# Patient Record
Sex: Male | Born: 1940 | Race: White | Hispanic: No | Smoking: Former smoker
Health system: Southern US, Community
[De-identification: ages and names within clinical notes are randomized; demographics above are authoritative.]

## PROBLEM LIST (undated history)

## (undated) DIAGNOSIS — E7800 Pure hypercholesterolemia, unspecified: Secondary | ICD-10-CM

## (undated) DIAGNOSIS — J449 Chronic obstructive pulmonary disease, unspecified: Secondary | ICD-10-CM

## (undated) DIAGNOSIS — I10 Essential (primary) hypertension: Secondary | ICD-10-CM

## (undated) HISTORY — DX: Essential (primary) hypertension: I10

## (undated) HISTORY — DX: Pure hypercholesterolemia, unspecified: E78.00

## (undated) HISTORY — PX: FACIAL FRACTURE SURGERY: SHX1570

## (undated) HISTORY — PX: LEG SURGERY: SHX1003

---

## 2004-03-24 ENCOUNTER — Ambulatory Visit (HOSPITAL_COMMUNITY): Admission: RE | Admit: 2004-03-24 | Discharge: 2004-03-24 | Payer: Self-pay | Admitting: Family Medicine

## 2004-05-27 ENCOUNTER — Emergency Department (HOSPITAL_COMMUNITY): Admission: EM | Admit: 2004-05-27 | Discharge: 2004-05-27 | Payer: Self-pay | Admitting: Emergency Medicine

## 2005-08-12 ENCOUNTER — Emergency Department (HOSPITAL_COMMUNITY): Admission: EM | Admit: 2005-08-12 | Discharge: 2005-08-12 | Payer: Self-pay | Admitting: Emergency Medicine

## 2005-09-21 ENCOUNTER — Ambulatory Visit (HOSPITAL_COMMUNITY): Admission: RE | Admit: 2005-09-21 | Discharge: 2005-09-21 | Payer: Self-pay | Admitting: General Surgery

## 2008-05-27 ENCOUNTER — Ambulatory Visit (HOSPITAL_COMMUNITY): Admission: RE | Admit: 2008-05-27 | Discharge: 2008-05-27 | Payer: Self-pay | Admitting: Family Medicine

## 2008-07-30 ENCOUNTER — Ambulatory Visit (HOSPITAL_COMMUNITY): Admission: RE | Admit: 2008-07-30 | Discharge: 2008-07-30 | Payer: Self-pay | Admitting: Family Medicine

## 2009-06-27 ENCOUNTER — Emergency Department (HOSPITAL_COMMUNITY): Admission: EM | Admit: 2009-06-27 | Discharge: 2009-06-27 | Payer: Self-pay | Admitting: Emergency Medicine

## 2009-08-18 ENCOUNTER — Emergency Department (HOSPITAL_COMMUNITY): Admission: EM | Admit: 2009-08-18 | Discharge: 2009-08-18 | Payer: Self-pay | Admitting: Emergency Medicine

## 2010-07-26 ENCOUNTER — Encounter: Payer: Self-pay | Admitting: Gastroenterology

## 2010-07-26 ENCOUNTER — Encounter: Payer: Self-pay | Admitting: Internal Medicine

## 2010-07-26 ENCOUNTER — Ambulatory Visit (INDEPENDENT_AMBULATORY_CARE_PROVIDER_SITE_OTHER): Payer: Medicare Other | Admitting: Gastroenterology

## 2010-07-26 DIAGNOSIS — Z1211 Encounter for screening for malignant neoplasm of colon: Secondary | ICD-10-CM
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
## 2010-08-04 NOTE — Letter (Signed)
Summary: TCS ODER  TCS ODER   Imported By: Rexene Alberts 07/26/2010 14:33:00  _____________________________________________________________________  External Attachment:    Type:   Image     Comment:   External Document

## 2010-08-10 ENCOUNTER — Ambulatory Visit (HOSPITAL_COMMUNITY): Admission: RE | Admit: 2010-08-10 | Payer: Medicare Other | Source: Ambulatory Visit | Admitting: Internal Medicine

## 2010-08-10 ENCOUNTER — Encounter: Payer: Medicare Other | Admitting: Internal Medicine

## 2010-09-05 LAB — BASIC METABOLIC PANEL
BUN: 15 mg/dL (ref 6–23)
CO2: 27 mEq/L (ref 19–32)
Calcium: 8.6 mg/dL (ref 8.4–10.5)
Chloride: 98 mEq/L (ref 96–112)
Creatinine, Ser: 0.9 mg/dL (ref 0.4–1.5)
GFR calc Af Amer: >60 mL/min (ref >60)
GFR calc non Af Amer: >60 mL/min (ref >60)
Glucose, Bld: 100 mg/dL — ABNORMAL HIGH (ref 70–99)
Potassium: 3.8 mEq/L (ref 3.5–5.1)
Sodium: 134 mEq/L — ABNORMAL LOW (ref 135–145)

## 2010-09-05 LAB — CBC
HCT: 45.5 % (ref 39.0–52.0)
Hemoglobin: 16.5 g/dL (ref 13.0–17.0)
MCHC: 36.2 g/dL — ABNORMAL HIGH (ref 30.0–36.0)
MCV: 94.7 fL (ref 78.0–100.0)
Platelets: 218 10*3/uL (ref 150–400)
RBC: 4.8 MIL/uL (ref 4.22–5.81)
RDW: 12.9 % (ref 11.5–15.5)
WBC: 8.8 10*3/uL (ref 4.0–10.5)

## 2010-09-05 LAB — DIFFERENTIAL
Basophils Absolute: 0 10*3/uL (ref 0.0–0.1)
Basophils Relative: 0 % (ref 0–1)
Eosinophils Absolute: 0 10*3/uL (ref 0.0–0.7)
Eosinophils Relative: 0 % (ref 0–5)
Lymphocytes Relative: 18 % (ref 12–46)
Lymphs Abs: 1.6 10*3/uL (ref 0.7–4.0)
Monocytes Absolute: 1 10*3/uL (ref 0.1–1.0)
Monocytes Relative: 12 % (ref 3–12)
Neutro Abs: 6.1 10*3/uL (ref 1.7–7.7)
Neutrophils Relative %: 70 % (ref 43–77)

## 2010-09-05 LAB — POCT CARDIAC MARKERS
CKMB, poc: 1.6 ng/mL (ref 1.0–8.0)
Myoglobin, poc: 339 ng/mL (ref 12–200)
Troponin i, poc: <0.05 ng/mL (ref 0.00–0.09)

## 2013-10-16 ENCOUNTER — Emergency Department (HOSPITAL_COMMUNITY): Payer: Medicare Other

## 2013-10-16 ENCOUNTER — Encounter (HOSPITAL_COMMUNITY): Payer: Self-pay | Admitting: Emergency Medicine

## 2013-10-16 ENCOUNTER — Emergency Department (HOSPITAL_COMMUNITY)
Admission: EM | Admit: 2013-10-16 | Discharge: 2013-10-16 | Disposition: A | Discharge status: Home / Self Care | Payer: Medicare Other | Attending: Emergency Medicine | Admitting: Emergency Medicine

## 2013-10-16 DIAGNOSIS — R4181 Age-related cognitive decline: Secondary | ICD-10-CM | POA: Insufficient documentation

## 2013-10-16 DIAGNOSIS — J40 Bronchitis, not specified as acute or chronic: Secondary | ICD-10-CM

## 2013-10-16 DIAGNOSIS — J209 Acute bronchitis, unspecified: Secondary | ICD-10-CM | POA: Insufficient documentation

## 2013-10-16 DIAGNOSIS — I10 Essential (primary) hypertension: Secondary | ICD-10-CM | POA: Insufficient documentation

## 2013-10-16 DIAGNOSIS — F17200 Nicotine dependence, unspecified, uncomplicated: Secondary | ICD-10-CM | POA: Insufficient documentation

## 2013-10-16 DIAGNOSIS — Z8639 Personal history of other endocrine, nutritional and metabolic disease: Secondary | ICD-10-CM | POA: Insufficient documentation

## 2013-10-16 DIAGNOSIS — Z862 Personal history of diseases of the blood and blood-forming organs and certain disorders involving the immune mechanism: Secondary | ICD-10-CM | POA: Insufficient documentation

## 2013-10-16 DIAGNOSIS — Z720 Tobacco use: Secondary | ICD-10-CM

## 2013-10-16 LAB — CBC WITH DIFFERENTIAL/PLATELET
Basophils Absolute: 0 10*3/uL (ref 0.0–0.1)
Basophils Relative: 0 % (ref 0–1)
Eosinophils Absolute: 0 10*3/uL (ref 0.0–0.7)
Eosinophils Relative: 0 % (ref 0–5)
HCT: 45.4 % (ref 39.0–52.0)
Hemoglobin: 16.3 g/dL (ref 13.0–17.0)
Lymphocytes Relative: 12 % (ref 12–46)
Lymphs Abs: 2.2 10*3/uL (ref 0.7–4.0)
MCH: 34.4 pg — ABNORMAL HIGH (ref 26.0–34.0)
MCHC: 35.9 g/dL (ref 30.0–36.0)
MCV: 95.8 fL (ref 78.0–100.0)
Monocytes Absolute: 1.4 10*3/uL — ABNORMAL HIGH (ref 0.1–1.0)
Monocytes Relative: 8 % (ref 3–12)
Neutro Abs: 14.4 10*3/uL — ABNORMAL HIGH (ref 1.7–7.7)
Neutrophils Relative %: 80 % — ABNORMAL HIGH (ref 43–77)
Platelets: 230 10*3/uL (ref 150–400)
RBC: 4.74 MIL/uL (ref 4.22–5.81)
RDW: 12.8 % (ref 11.5–15.5)
WBC Morphology: INCREASED
WBC: 18 10*3/uL — ABNORMAL HIGH (ref 4.0–10.5)

## 2013-10-16 LAB — TROPONIN I: Troponin I: <0.3 ng/mL (ref <0.30)

## 2013-10-16 MED ORDER — PREDNISONE 50 MG PO TABS
60.0000 mg | ORAL_TABLET | Freq: Once | ORAL | Status: AC
  Administered 2013-10-16: 60 mg via ORAL
  Filled 2013-10-16 (×2): qty 1

## 2013-10-16 MED ORDER — LEVOFLOXACIN 750 MG PO TABS
750.0000 mg | ORAL_TABLET | Freq: Once | ORAL | Status: AC
  Administered 2013-10-16: 750 mg via ORAL
  Filled 2013-10-16: qty 1

## 2013-10-16 MED ORDER — PREDNISONE 20 MG PO TABS: 20.0000 mg | ORAL_TABLET | Freq: Two times a day (BID) | ORAL | Status: DC

## 2013-10-16 MED ORDER — LEVOFLOXACIN 500 MG PO TABS: 500.0000 mg | ORAL_TABLET | Freq: Every day | ORAL | Status: DC

## 2013-10-16 NOTE — Discharge Instructions (Signed)
Bronchitis Bronchitis is inflammation of the airways that extend from the windpipe into the lungs (bronchi). The inflammation often causes mucus to develop, which leads to a cough. If the inflammation becomes severe, it may cause shortness of breath. CAUSES  Bronchitis may be caused by:   Viral infections.   Bacteria.   Cigarette smoke.   Allergens, pollutants, and other irritants.  SIGNS AND SYMPTOMS  The most common symptom of bronchitis is a frequent cough that produces mucus. Other symptoms include:  Fever.   Body aches.   Chest congestion.   Chills.   Shortness of breath.   Sore throat.  DIAGNOSIS  Bronchitis is usually diagnosed through a medical history and physical exam. Tests, such as chest X-rays, are sometimes done to rule out other conditions.  TREATMENT  You may need to avoid contact with whatever caused the problem (smoking, for example). Medicines are sometimes needed. These may include:  Antibiotics. These may be prescribed if the condition is caused by bacteria.  Cough suppressants. These may be prescribed for relief of cough symptoms.   Inhaled medicines. These may be prescribed to help open your airways and make it easier for you to breathe.   Steroid medicines. These may be prescribed for those with recurrent (chronic) bronchitis. HOME CARE INSTRUCTIONS  Get plenty of rest.   Drink enough fluids to keep your urine clear or pale yellow (unless you have a medical condition that requires fluid restriction). Increasing fluids may help thin your secretions and will prevent dehydration.   Only take over-the-counter or prescription medicines as directed by your health care provider.  Only take antibiotics as directed. Make sure you finish them even if you start to feel better.  Avoid secondhand smoke, irritating chemicals, and strong fumes. These will make bronchitis worse. If you are a smoker, quit smoking. Consider using nicotine gum or  skin patches to help control withdrawal symptoms. Quitting smoking will help your lungs heal faster.   Put a cool-mist humidifier in your bedroom at night to moisten the air. This may help loosen mucus. Change the water in the humidifier daily. You can also run the hot water in your shower and sit in the bathroom with the door closed for 5 10 minutes.   Follow up with your health care provider as directed.   Wash your hands frequently to avoid catching bronchitis again or spreading an infection to others.  SEEK MEDICAL CARE IF: Your symptoms do not improve after 1 week of treatment.  SEEK IMMEDIATE MEDICAL CARE IF:  Your fever increases.  You have chills.   You have chest pain.   You have worsening shortness of breath.   You have bloody sputum.  You faint.  You have lightheadedness.  You have a severe headache.   You vomit repeatedly. MAKE SURE YOU:   Understand these instructions.  Will watch your condition.  Will get help right away if you are not doing well or get worse. Document Released: 05/30/2005 Document Revised: 03/20/2013 Document Reviewed: 01/22/2013 [facility] Patient Information 2014 [place].  Smoking Hazards Smoking cigarettes is extremely bad for your health. Tobacco smoke has over 200 known poisons in it. It contains the poisonous gases nitrogen oxide and carbon monoxide. There are over 60 chemicals in tobacco smoke that cause cancer. Some of the chemicals found in cigarette smoke include:   Cyanide.   Benzene.   Formaldehyde.   Methanol (wood alcohol).   Acetylene (fuel used in welding torches).   Ammonia.  Even  smoking lightly shortens your life expectancy by several years. You can greatly reduce the risk of medical problems for you and your family by stopping now. Smoking is the most preventable cause of death and disease in our society. Within days of quitting smoking, your circulation improves, you decrease the risk of  having a heart attack, and your lung capacity improves. There may be some increased phlegm in the first few days after quitting, and it may take months for your lungs to clear up completely. Quitting for 10 years reduces your risk of developing lung cancer to almost that of a nonsmoker.  WHAT ARE THE RISKS OF SMOKING? Cigarette smokers have an increased risk of many serious medical problems, including:  Lung cancer.   Lung disease (such as pneumonia, bronchitis, and emphysema).   Heart attack and chest pain due to the heart not getting enough oxygen (angina).   Heart disease and peripheral blood vessel disease.   Hypertension.   Stroke.   Oral cancer (cancer of the lip, mouth, or voice box).   Bladder cancer.   Pancreatic cancer.   Cervical cancer.   Pregnancy complications, including premature birth.   Stillbirths and smaller newborn babies, birth defects, and genetic damage to sperm.   Early menopause.   Lower estrogen level for women.   Infertility.   Facial wrinkles.   Blindness.   Increased risk of broken bones (fractures).   Senile dementia.   Stomach ulcers and internal bleeding.   Delayed wound healing and increased risk of complications during surgery. Because of secondhand smoke exposure, children of smokers have an increased risk of the following:   Sudden infant death syndrome (SIDS).   Respiratory infections.   Lung cancer.   Heart disease.   Ear infections.  WHY IS SMOKING ADDICTIVE? Nicotine is the chemical agent in tobacco that is capable of causing addiction or dependence. When you smoke and inhale, nicotine is absorbed rapidly into the bloodstream through your lungs. Both inhaled and noninhaled nicotine may be addictive.  WHAT ARE THE BENEFITS OF QUITTING?  There are many health benefits to quitting smoking. Some are:   The likelihood of developing cancer and heart disease decreases. Health improvements are seen  almost immediately.   Blood pressure, pulse rate, and breathing patterns start returning to normal soon after quitting.   People who quit may see an improvement in their overall quality of life.  HOW DO YOU QUIT SMOKING? Smoking is an addiction with both physical and psychological effects, and longtime habits can be hard to change. Your health care provider can recommend:  Programs and community resources, which may include group support, education, or therapy.  Replacement products, such as patches, gum, and nasal sprays. Use these products only as directed. Do not replace cigarette smoking with electronic cigarettes (commonly called e-cigarettes). The safety of e-cigarettes is unknown, and some may contain harmful chemicals. FOR MORE INFORMATION  American Lung Association: www.lung.org  American Cancer Society: www.cancer.org Document Released: 07/07/2004 Document Revised: 03/20/2013 Document Reviewed: 11/19/2012 [facility] Patient Information 2014 [place], [place].  Smoking Cessation Quitting smoking is important to your health and has many advantages. However, it is not always easy to quit since nicotine is a very addictive drug. Often times, people try 3 times or more before being able to quit. This document explains the best ways for you to prepare to quit smoking. Quitting takes hard work and a lot of effort, but you can do it. ADVANTAGES OF QUITTING SMOKING  You will live longer,  feel better, and live better.  Your body will feel the impact of quitting smoking almost immediately.  Within 20 minutes, blood pressure decreases. Your pulse returns to its normal level.  After 8 hours, carbon monoxide levels in the blood return to normal. Your oxygen level increases.  After 24 hours, the chance of having a heart attack starts to decrease. Your breath, hair, and body stop smelling like smoke.  After 48 hours, damaged nerve endings begin to recover. Your sense of taste and smell  improve.  After 72 hours, the body is virtually free of nicotine. Your bronchial tubes relax and breathing becomes easier.  After 2 to 12 weeks, lungs can hold more air. Exercise becomes easier and circulation improves.  The risk of having a heart attack, stroke, cancer, or lung disease is greatly reduced.  After 1 year, the risk of coronary heart disease is cut in half.  After 5 years, the risk of stroke falls to the same as a nonsmoker.  After 10 years, the risk of lung cancer is cut in half and the risk of other cancers decreases significantly.  After 15 years, the risk of coronary heart disease drops, usually to the level of a nonsmoker.  If you are pregnant, quitting smoking will improve your chances of having a healthy baby.  The people you live with, especially any children, will be healthier.  You will have extra money to spend on things other than cigarettes. QUESTIONS TO THINK ABOUT BEFORE ATTEMPTING TO QUIT You may want to talk about your answers with your caregiver.  Why do you want to quit?  If you tried to quit in the past, what helped and what did not?  What will be the most difficult situations for you after you quit? How will you plan to handle them?  Who can help you through the tough times? Your family? Friends? A caregiver?  What pleasures do you get from smoking? What ways can you still get pleasure if you quit? Here are some questions to ask your caregiver:  How can you help me to be successful at quitting?  What medicine do you think would be best for me and how should I take it?  What should I do if I need more help?  What is smoking withdrawal like? How can I get information on withdrawal? GET READY  Set a quit date.  Change your environment by getting rid of all cigarettes, ashtrays, matches, and lighters in your home, car, or work. Do not let people smoke in your home.  Review your past attempts to quit. Think about what worked and what did  not. GET SUPPORT AND ENCOURAGEMENT You have a better chance of being successful if you have help. You can get support in many ways.  Tell your family, friends, and co-workers that you are going to quit and need their support. Ask them not to smoke around you.  Get individual, group, or telephone counseling and support. Programs are available at [facility]. Call your local health department for information about programs in your area.  Spiritual beliefs and practices may help some smokers quit.  Download a "quit meter" on your computer to keep track of quit statistics, such as how long you have gone without smoking, cigarettes not smoked, and money saved.  Get a self-help book about quitting smoking and staying off of tobacco. LEARN NEW SKILLS AND BEHAVIORS  Distract yourself from urges to smoke. Talk to someone, go for a  walk, or occupy your time with a task.  Change your normal routine. Take a different route to work. Drink tea instead of coffee. Eat breakfast in a different place.  Reduce your stress. Take a hot bath, exercise, or read a book.  Plan something enjoyable to do every day. Reward yourself for not smoking.  Explore interactive web-based programs that specialize in helping you quit. GET MEDICINE AND USE IT CORRECTLY Medicines can help you stop smoking and decrease the urge to smoke. Combining medicine with the above behavioral methods and support can greatly increase your chances of successfully quitting smoking.  Nicotine replacement therapy helps deliver nicotine to your body without the negative effects and risks of smoking. Nicotine replacement therapy includes nicotine gum, lozenges, inhalers, nasal sprays, and skin patches. Some may be available over-the-counter and others require a prescription.  Antidepressant medicine helps people abstain from smoking, but how this works is unknown. This medicine is available by prescription.  Nicotinic  receptor partial agonist medicine simulates the effect of nicotine in your brain. This medicine is available by prescription. Ask your caregiver for advice about which medicines to use and how to use them based on your health history. Your caregiver will tell you what side effects to look out for if you choose to be on a medicine or therapy. Carefully read the information on the package. Do not use any other product containing nicotine while using a nicotine replacement product.  RELAPSE OR DIFFICULT SITUATIONS Most relapses occur within the first 3 months after quitting. Do not be discouraged if you start smoking again. Remember, most people try several times before finally quitting. You may have symptoms of withdrawal because your body is used to nicotine. You may crave cigarettes, be irritable, feel very hungry, cough often, get headaches, or have difficulty concentrating. The withdrawal symptoms are only temporary. They are strongest when you first quit, but they will go away within 10 14 days. To reduce the chances of relapse, try to:  Avoid drinking alcohol. Drinking lowers your chances of successfully quitting.  Reduce the amount of caffeine you consume. Once you quit smoking, the amount of caffeine in your body increases and can give you symptoms, such as a rapid heartbeat, sweating, and anxiety.  Avoid smokers because they can make you want to smoke.  Do not let weight gain distract you. Many smokers will gain weight when they quit, usually less than 10 pounds. Eat a healthy diet and stay active. You can always lose the weight gained after you quit.  Find ways to improve your mood other than smoking. FOR MORE INFORMATION  www.smokefree.gov  Document Released: 05/24/2001 Document Revised: 11/29/2011 Document Reviewed: 09/08/2011 [facility] Patient Information 2014 [place], [place].  You Can Quit Smoking If you are ready to quit smoking or are thinking about it, congratulations! You have  chosen to help yourself be healthier and live longer! There are lots of different ways to quit smoking. Nicotine gum, nicotine patches, a nicotine inhaler, or nicotine nasal spray can help with physical craving. Hypnosis, support groups, and medicines help break the habit of smoking. TIPS TO GET OFF AND STAY OFF CIGARETTES  Learn to predict your moods. Do not let a bad situation be your excuse to have a cigarette. Some situations in your life might tempt you to have a cigarette.  Ask friends and co-workers not to smoke around you.  Make your home smoke-free.  Never have "just one" cigarette. It leads to wanting another and another. Remind yourself  of your decision to quit.  On a card, make a list of your reasons for not smoking. Read it at least the same number of times a day as you have a cigarette. Tell yourself everyday, "I do not want to smoke. I choose not to smoke."  Ask someone at home or work to help you with your plan to quit smoking.  Have something planned after you eat or have a cup of coffee. Take a walk or get other exercise to perk you up. This will help to keep you from overeating.  Try a relaxation exercise to calm you down and decrease your stress. Remember, you may be tense and nervous the first two weeks after you quit. This will pass.  Find new activities to keep your hands busy. Play with a pen, coin, or rubber band. Doodle or draw things on paper.  Brush your teeth right after eating. This will help cut down the craving for the taste of tobacco after meals. You can try mouthwash too.  Try gum, breath mints, or diet candy to keep something in your mouth. IF YOU SMOKE AND WANT TO QUIT:  Do not stock up on cigarettes. Never buy a carton. Wait until one pack is finished before you buy another.  Never carry cigarettes with you at work or at home.  Keep cigarettes as far away from you as possible. Leave them with someone else.  Never carry matches or a lighter with  you.  Ask yourself, "Do I need this cigarette or is this just a reflex?"  Bet with someone that you can quit. Put cigarette money in a piggy bank every morning. If you smoke, you give up the money. If you do not smoke, by the end of the week, you keep the money.  Keep trying. It takes 21 days to change a habit!  Talk to your doctor about using medicines to help you quit. These include nicotine replacement gum, lozenges, or skin patches. Document Released: 03/26/2009 Document Revised: 08/22/2011 Document Reviewed: 03/26/2009 [facility] Patient Information 2014 [place], [place].  Smoking Cessation, Tips for Success If you are ready to quit smoking, congratulations! You have chosen to help yourself be healthier. Cigarettes bring nicotine, tar, carbon monoxide, and other irritants into your body. Your lungs, heart, and blood vessels will be able to work better without these poisons. There are many different ways to quit smoking. Nicotine gum, nicotine patches, a nicotine inhaler, or nicotine nasal spray can help with physical craving. Hypnosis, support groups, and medicines help break the habit of smoking. WHAT THINGS CAN I DO TO MAKE QUITTING EASIER?  Here are some tips to help you quit for good:  Pick a date when you will quit smoking completely. Tell all of your friends and family about your plan to quit on that date.  Do not try to slowly cut down on the number of cigarettes you are smoking. Pick a quit date and quit smoking completely starting on that day.  Throw away all cigarettes.   Clean and remove all ashtrays from your home, work, and car.   On a card, write down your reasons for quitting. Carry the card with you and read it when you get the urge to smoke.   Cleanse your body of nicotine. Drink enough water and fluids to keep your urine clear or pale yellow. Do this after quitting to flush the nicotine from your body.   Learn to predict your moods. Do not let a bad situation be  your excuse to have a cigarette. Some situations in your life might tempt you into wanting a cigarette.   Never have "just one" cigarette. It leads to wanting another and another. Remind yourself of your decision to quit.   Change habits associated with smoking. If you smoked while driving or when feeling stressed, try other activities to replace smoking. Stand up when drinking your coffee. Brush your teeth after eating. Sit in a different chair when you read the paper. Avoid alcohol while trying to quit, and try to drink fewer caffeinated beverages. Alcohol and caffeine may urge you to smoke.   Avoid foods and drinks that can trigger a desire to smoke, such as sugary or spicy foods and alcohol.   Ask people who smoke not to smoke around you.   Have something planned to do right after eating or having a cup of coffee. For example, plan to take a walk or exercise.   Try a relaxation exercise to calm you down and decrease your stress. Remember, you may be tense and nervous for the first 2 weeks after you quit, but this will pass.   Find new activities to keep your hands busy. Play with a pen, coin, or rubber band. Doodle or draw things on paper.   Brush your teeth right after eating. This will help cut down on the craving for the taste of tobacco after meals. You can also try mouthwash.   Use oral substitutes in place of cigarettes. Try using lemon drops, carrots, cinnamon sticks, or chewing gum. Keep them handy so they are available when you have the urge to smoke.   When you have the urge to smoke, try deep breathing.   Designate your home as a nonsmoking area.   If you are a heavy smoker, ask your health care provider about a prescription for nicotine chewing gum. It can ease your withdrawal from nicotine.   Reward yourself. Set aside the cigarette money you save and buy yourself something nice.   Look for support from others. Join a support group or smoking cessation  program. Ask someone at home or at work to help you with your plan to quit smoking.   Always ask yourself, "Do I need this cigarette or is this just a reflex?" Tell yourself, "Today, I choose not to smoke," or "I do not want to smoke." You are reminding yourself of your decision to quit.  Do not replace cigarette smoking with electronic cigarettes (commonly called e-cigarettes). The safety of e-cigarettes is unknown, and some may contain harmful chemicals.  If you relapse, do not give up! Plan ahead and think about what you will do the next time you get the urge to smoke.  HOW WILL I FEEL WHEN I QUIT SMOKING? You may have symptoms of withdrawal because your body is used to nicotine (the addictive substance in cigarettes). You may crave cigarettes, be irritable, feel very hungry, cough often, get headaches, or have difficulty concentrating. The withdrawal symptoms are only temporary. They are strongest when you first quit but will go away within 10 14 days. When withdrawal symptoms occur, stay in control. Think about your reasons for quitting. Remind yourself that these are signs that your body is healing and getting used to being without cigarettes. Remember that withdrawal symptoms are easier to treat than the major diseases that smoking can cause.  Even after the withdrawal is over, expect periodic urges to smoke. However, these cravings are generally short lived and will go away whether  you smoke or not. Do not smoke!  WHAT RESOURCES ARE AVAILABLE TO HELP ME QUIT SMOKING? Your health care provider can direct you to community resources or hospitals for support, which may include:  Group support.  Education.  Hypnosis.  Therapy. Document Released: 02/26/2004 Document Revised: 03/20/2013 Document Reviewed: 11/15/2012 [facility] Patient Information 2014 [place], [place].
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
## 2013-10-16 NOTE — ED Notes (Signed)
Pt c/o cough, SOB and chest pain. Pt states cough is productive with green sputum. Pt describes chest pain as "soreness".

## 2013-11-29 ENCOUNTER — Other Ambulatory Visit (HOSPITAL_COMMUNITY): Payer: Self-pay | Admitting: Family Medicine

## 2013-11-29 ENCOUNTER — Ambulatory Visit (HOSPITAL_COMMUNITY)
Admission: RE | Admit: 2013-11-29 | Discharge: 2013-11-29 | Disposition: A | Discharge status: Home / Self Care | Payer: Medicare Other | Source: Ambulatory Visit | Attending: Family Medicine | Admitting: Family Medicine

## 2013-11-29 DIAGNOSIS — M25511 Pain in right shoulder: Secondary | ICD-10-CM

## 2013-11-29 DIAGNOSIS — J449 Chronic obstructive pulmonary disease, unspecified: Secondary | ICD-10-CM

## 2013-11-29 DIAGNOSIS — M25519 Pain in unspecified shoulder: Secondary | ICD-10-CM | POA: Insufficient documentation

## 2013-11-29 DIAGNOSIS — J4489 Other specified chronic obstructive pulmonary disease: Secondary | ICD-10-CM | POA: Insufficient documentation

## 2013-11-29 DIAGNOSIS — M19019 Primary osteoarthritis, unspecified shoulder: Secondary | ICD-10-CM | POA: Insufficient documentation

## 2015-04-13 ENCOUNTER — Inpatient Hospital Stay (HOSPITAL_COMMUNITY)
Admission: EM | Admit: 2015-04-13 | Discharge: 2015-04-17 | DRG: 190 | Disposition: A | Discharge status: Home / Self Care | Payer: Medicare Other | Attending: Family Medicine | Admitting: Family Medicine

## 2015-04-13 ENCOUNTER — Encounter (HOSPITAL_COMMUNITY): Payer: Self-pay

## 2015-04-13 ENCOUNTER — Emergency Department (HOSPITAL_COMMUNITY): Payer: Medicare Other

## 2015-04-13 DIAGNOSIS — J189 Pneumonia, unspecified organism: Secondary | ICD-10-CM | POA: Diagnosis present

## 2015-04-13 DIAGNOSIS — J441 Chronic obstructive pulmonary disease with (acute) exacerbation: Principal | ICD-10-CM | POA: Diagnosis present

## 2015-04-13 DIAGNOSIS — F17210 Nicotine dependence, cigarettes, uncomplicated: Secondary | ICD-10-CM | POA: Diagnosis present

## 2015-04-13 DIAGNOSIS — I10 Essential (primary) hypertension: Secondary | ICD-10-CM | POA: Diagnosis not present

## 2015-04-13 DIAGNOSIS — Z720 Tobacco use: Secondary | ICD-10-CM | POA: Diagnosis present

## 2015-04-13 DIAGNOSIS — E785 Hyperlipidemia, unspecified: Secondary | ICD-10-CM | POA: Diagnosis present

## 2015-04-13 DIAGNOSIS — E7800 Pure hypercholesterolemia, unspecified: Secondary | ICD-10-CM | POA: Diagnosis present

## 2015-04-13 DIAGNOSIS — R0600 Dyspnea, unspecified: Secondary | ICD-10-CM | POA: Diagnosis present

## 2015-04-13 HISTORY — DX: Chronic obstructive pulmonary disease, unspecified: J44.9

## 2015-04-13 LAB — CBC
HEMATOCRIT: 44.1 % (ref 39.0–52.0)
Hemoglobin: 15.2 g/dL (ref 13.0–17.0)
MCH: 33.9 pg (ref 26.0–34.0)
MCHC: 34.5 g/dL (ref 30.0–36.0)
MCV: 98.2 fL (ref 78.0–100.0)
PLATELETS: 247 10*3/uL (ref 150–400)
RBC: 4.49 MIL/uL (ref 4.22–5.81)
RDW: 13 % (ref 11.5–15.5)
WBC: 15.3 10*3/uL — ABNORMAL HIGH (ref 4.0–10.5)

## 2015-04-13 LAB — BASIC METABOLIC PANEL
Anion gap: 11 (ref 5–15)
BUN: 14 mg/dL (ref 6–20)
CHLORIDE: 100 mmol/L — AB (ref 101–111)
CO2: 24 mmol/L (ref 22–32)
Calcium: 9.1 mg/dL (ref 8.9–10.3)
Creatinine, Ser: 1.04 mg/dL (ref 0.61–1.24)
GFR calc non Af Amer: >60 mL/min (ref >60)
Glucose, Bld: 189 mg/dL — ABNORMAL HIGH (ref 65–99)
POTASSIUM: 3.7 mmol/L (ref 3.5–5.1)
SODIUM: 135 mmol/L (ref 135–145)

## 2015-04-13 LAB — TROPONIN I: Troponin I: <0.03 ng/mL (ref <0.031)

## 2015-04-13 LAB — TSH: TSH: 1.199 u[IU]/mL (ref 0.350–4.500)

## 2015-04-13 MED ORDER — SODIUM CHLORIDE 0.9 % IJ SOLN
3.0000 mL | Freq: Two times a day (BID) | INTRAMUSCULAR | Status: DC
Start: 2015-04-13 — End: 2015-04-17
  Administered 2015-04-14 – 2015-04-17 (×8): 3 mL via INTRAVENOUS

## 2015-04-13 MED ORDER — IPRATROPIUM BROMIDE 0.02 % IN SOLN
1.0000 mg | Freq: Once | RESPIRATORY_TRACT | Status: AC
  Administered 2015-04-13: 1 mg via RESPIRATORY_TRACT
  Filled 2015-04-13: qty 5

## 2015-04-13 MED ORDER — ONDANSETRON HCL 4 MG PO TABS: 4.0000 mg | ORAL_TABLET | Freq: Four times a day (QID) | ORAL | Status: DC | PRN

## 2015-04-13 MED ORDER — ALBUTEROL SULFATE (2.5 MG/3ML) 0.083% IN NEBU
2.5000 mg | INHALATION_SOLUTION | Freq: Four times a day (QID) | RESPIRATORY_TRACT | Status: DC
  Administered 2015-04-13 – 2015-04-15 (×8): 2.5 mg via RESPIRATORY_TRACT
  Filled 2015-04-13 (×8): qty 3

## 2015-04-13 MED ORDER — DEXTROSE 5 % IV SOLN
1.0000 g | INTRAVENOUS | Status: DC
  Administered 2015-04-14 – 2015-04-17 (×4): 1 g via INTRAVENOUS
  Filled 2015-04-13 (×7): qty 10

## 2015-04-13 MED ORDER — DEXTROSE 5 % IV SOLN
500.0000 mg | INTRAVENOUS | Status: DC
  Administered 2015-04-14 – 2015-04-17 (×4): 500 mg via INTRAVENOUS
  Filled 2015-04-13 (×7): qty 500

## 2015-04-13 MED ORDER — ONDANSETRON HCL 4 MG/2ML IJ SOLN: 4.0000 mg | Freq: Four times a day (QID) | INTRAMUSCULAR | Status: DC | PRN

## 2015-04-13 MED ORDER — DEXTROSE 5 % IV SOLN
1.0000 g | Freq: Once | INTRAVENOUS | Status: AC
  Administered 2015-04-13: 1 g via INTRAVENOUS
  Filled 2015-04-13: qty 10

## 2015-04-13 MED ORDER — METHYLPREDNISOLONE SODIUM SUCC 40 MG IJ SOLR
40.0000 mg | Freq: Four times a day (QID) | INTRAMUSCULAR | Status: DC
  Administered 2015-04-13 – 2015-04-17 (×16): 40 mg via INTRAVENOUS
  Filled 2015-04-13 (×16): qty 1

## 2015-04-13 MED ORDER — ACETAMINOPHEN 650 MG RE SUPP
650.0000 mg | Freq: Four times a day (QID) | RECTAL | Status: DC | PRN
Start: 2015-04-13 — End: 2015-04-17

## 2015-04-13 MED ORDER — DEXTROSE 5 % IV SOLN
500.0000 mg | Freq: Once | INTRAVENOUS | Status: AC
  Administered 2015-04-13: 500 mg via INTRAVENOUS
  Filled 2015-04-13: qty 500

## 2015-04-13 MED ORDER — ACETAMINOPHEN 325 MG PO TABS: 650.0000 mg | ORAL_TABLET | Freq: Four times a day (QID) | ORAL | Status: DC | PRN

## 2015-04-13 MED ORDER — ALBUTEROL SULFATE (2.5 MG/3ML) 0.083% IN NEBU
INHALATION_SOLUTION | RESPIRATORY_TRACT | Status: AC
  Administered 2015-04-13: 2.5 mg
  Filled 2015-04-13: qty 3

## 2015-04-13 MED ORDER — ALBUTEROL (5 MG/ML) CONTINUOUS INHALATION SOLN
10.0000 mg/h | INHALATION_SOLUTION | Freq: Once | RESPIRATORY_TRACT | Status: AC
  Administered 2015-04-13: 10 mg/h via RESPIRATORY_TRACT
  Filled 2015-04-13: qty 20

## 2015-04-13 MED ORDER — ENOXAPARIN SODIUM 40 MG/0.4ML ~~LOC~~ SOLN
40.0000 mg | SUBCUTANEOUS | Status: DC
  Administered 2015-04-13 – 2015-04-16 (×4): 40 mg via SUBCUTANEOUS
  Filled 2015-04-13 (×5): qty 0.4

## 2015-04-13 MED ORDER — METHYLPREDNISOLONE SODIUM SUCC 125 MG IJ SOLR
125.0000 mg | Freq: Once | INTRAMUSCULAR | Status: AC
  Administered 2015-04-13: 125 mg via INTRAVENOUS
  Filled 2015-04-13: qty 2

## 2015-04-13 MED ORDER — ALBUTEROL SULFATE (2.5 MG/3ML) 0.083% IN NEBU: 2.5000 mg | INHALATION_SOLUTION | RESPIRATORY_TRACT | Status: DC

## 2015-04-13 MED ORDER — SODIUM CHLORIDE 0.9 % IV SOLN
INTRAVENOUS | Status: AC
  Administered 2015-04-13: 17:00:00 via INTRAVENOUS

## 2015-04-13 MED ORDER — ALBUTEROL SULFATE (2.5 MG/3ML) 0.083% IN NEBU: 2.5000 mg | INHALATION_SOLUTION | RESPIRATORY_TRACT | Status: DC | PRN

## 2015-04-13 NOTE — ED Notes (Signed)
Pt states  He has been SOB for two days. States he went to his PCP this morning and was sent here for evaluation
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
## 2015-04-13 NOTE — ED Notes (Signed)
Respiratory at bedside.

## 2015-04-14 LAB — BASIC METABOLIC PANEL
Anion gap: 8 (ref 5–15)
BUN: 18 mg/dL (ref 6–20)
CHLORIDE: 105 mmol/L (ref 101–111)
CO2: 27 mmol/L (ref 22–32)
Calcium: 9.3 mg/dL (ref 8.9–10.3)
Creatinine, Ser: 0.89 mg/dL (ref 0.61–1.24)
GFR calc Af Amer: >60 mL/min (ref >60)
GFR calc non Af Amer: >60 mL/min (ref >60)
GLUCOSE: 160 mg/dL — AB (ref 65–99)
POTASSIUM: 3.2 mmol/L — AB (ref 3.5–5.1)
Sodium: 140 mmol/L (ref 135–145)

## 2015-04-14 LAB — CBC
HCT: 42.6 % (ref 39.0–52.0)
HEMOGLOBIN: 14.6 g/dL (ref 13.0–17.0)
MCH: 33.2 pg (ref 26.0–34.0)
MCHC: 34.3 g/dL (ref 30.0–36.0)
MCV: 96.8 fL (ref 78.0–100.0)
Platelets: 246 10*3/uL (ref 150–400)
RBC: 4.4 MIL/uL (ref 4.22–5.81)
RDW: 12.9 % (ref 11.5–15.5)
WBC: 24.6 10*3/uL — ABNORMAL HIGH (ref 4.0–10.5)

## 2015-04-14 MED ORDER — POTASSIUM CHLORIDE CRYS ER 20 MEQ PO TBCR
20.0000 meq | EXTENDED_RELEASE_TABLET | Freq: Every day | ORAL | Status: DC
  Administered 2015-04-14 – 2015-04-17 (×4): 20 meq via ORAL
  Filled 2015-04-14 (×4): qty 1

## 2015-04-14 MED ORDER — NICOTINE 21 MG/24HR TD PT24
21.0000 mg | MEDICATED_PATCH | Freq: Every day | TRANSDERMAL | Status: DC
  Administered 2015-04-14 – 2015-04-17 (×4): 21 mg via TRANSDERMAL
  Filled 2015-04-14 (×4): qty 1

## 2015-04-14 NOTE — Care Management Note (Signed)
Case Management Note  Patient Details  Name: Fuller SongJames W Magel MRN: [id number] Date of Birth: 01/15/1941  Subjective/Objective:                  Pt admitted from home with COPD exacerbation. Pt lives with his daughter and will return home at discharge. Pt has a neb machine for home use. Pt is independent with ADl's.  Action/Plan: No CM needs noted.  Expected Discharge Date:                  Expected Discharge Plan:  Home/Self Care  In-House Referral:  NA  Discharge planning Services  CM Consult  Post Acute Care Choice:  NA Choice offered to:  NA  DME Arranged:    DME Agency:     HH Arranged:    HH Agency:     Status of Service:  Completed, signed off  Medicare Important Message Given:    Date Medicare IM Given:    Medicare IM give by:    Date Additional Medicare IM Given:    Additional Medicare Important Message give by:     If discussed at Long Length of Stay Meetings, dates discussed:    Additional Comments:  Cheryl FlashBlackwell, Damondre Pfeifle Crowder, RN 04/14/2015, 3:26 PM

## 2015-04-14 NOTE — Progress Notes (Signed)
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 rate 20, height '5\' 7"'  (1.702 m), weight 117 lb 11.2 oz (53.388 kg), SpO2 96 %. lungs show coarse rhonchi bilaterally prolonged inspiratory and a history phase and expiratory wheeze noted no rales appreciable heart regular rhythm no S3 or S4 no heaves thrills rubs abdomen soft nontender bowel sounds normoactive   Investigations:  No results found for this or any previous visit (from the past 240 hour(s)).   Basic Metabolic Panel:  Recent Labs  04/13/15 1036 04/14/15 0618  NA 135 140  K 3.7 3.2*  CL 100* 105  CO2 24 27  GLUCOSE 189* 160*  BUN 14 18  CREATININE 1.04 0.89  CALCIUM 9.1 9.3   Liver Function Tests: No results for input(s): AST, ALT, ALKPHOS, BILITOT, PROT, ALBUMIN in the last 72 hours.   CBC:  Recent Labs  04/13/15 1036 04/14/15 0618  WBC 15.3* 24.6*  HGB 15.2 14.6  HCT 44.1 42.6  MCV 98.2 96.8  PLT 247 246    Dg Chest Portable 1 View  04/13/2015  CLINICAL DATA:  Shortness of breath, cough EXAM: PORTABLE CHEST 1 VIEW COMPARISON:  11/29/2013 FINDINGS: There is hazy right lower lobe airspace disease. There is no other focal parenchymal opacity. There is no pleural effusion or pneumothorax. The heart and mediastinal contours are unremarkable. The osseous structures are unremarkable. IMPRESSION: Hazy right lower lobe airspace disease concerning for pneumonia appear Electronically Signed   By: Kathreen Devoid   On: 04/13/2015 10:37      Medications:   Impression: Hypokalemia  Principal Problem:   COPD with acute exacerbation ([place]) Active Problems:   HLD (hyperlipidemia)   HTN  (hypertension)   Continuous tobacco abuse   COPD exacerbation (HCC)     Plan: KCl 20 mEq by mouth daily continue current regimen add Chantix and nicotine nicotine patch 21 mg by mouth daily for withdrawal check be met in a.m.  Consultants:    Procedures   Antibiotics: Rocephin and Zithromax                  Code Status: Full   Family Communication:    Disposition Plan see plan above  Time spent: 30 minutes   LOS: 1 day   Damyia Strider M   04/14/2015, 11:24 AM

## 2015-04-15 LAB — BASIC METABOLIC PANEL
ANION GAP: 8 (ref 5–15)
BUN: 22 mg/dL — ABNORMAL HIGH (ref 6–20)
CALCIUM: 9 mg/dL (ref 8.9–10.3)
CO2: 27 mmol/L (ref 22–32)
Chloride: 105 mmol/L (ref 101–111)
Creatinine, Ser: 0.86 mg/dL (ref 0.61–1.24)
GFR calc non Af Amer: >60 mL/min (ref >60)
Glucose, Bld: 146 mg/dL — ABNORMAL HIGH (ref 65–99)
Potassium: 3.8 mmol/L (ref 3.5–5.1)
Sodium: 140 mmol/L (ref 135–145)

## 2015-04-15 LAB — LACTIC ACID, PLASMA: Lactic Acid, Venous: 3.1 mmol/L (ref 0.5–2.0)

## 2015-04-15 MED ORDER — ALBUTEROL SULFATE (2.5 MG/3ML) 0.083% IN NEBU
2.5000 mg | INHALATION_SOLUTION | Freq: Three times a day (TID) | RESPIRATORY_TRACT | Status: DC
  Administered 2015-04-16 – 2015-04-17 (×4): 2.5 mg via RESPIRATORY_TRACT
  Filled 2015-04-15 (×5): qty 3

## 2015-04-15 NOTE — Progress Notes (Signed)
End-stage COPD and community acquired pneumonia currently on Rocephin and Zithromax IV steroids count jumped to 24,000 from 12 on admission will check lactic acid and repeat CBC in a.m. no clinical septicemia at present no rigors chills hyportension Len BlalockJames W [facility] ZOX:[id number] DOB: 01/12/1941 DOA: 04/13/2015 PCP: Isabella StallingNDIEGO,Thaddus Mcdowell M, MD             Physical Exam: Blood pressure 136/62, pulse 106, temperature 98 F (36.7 C), temperature source Oral, resp. rate 19, height 5\' 7"  (1.702 m), weight 117 lb 11.2 oz (53.388 kg), SpO2 91 %. lungs show prolonged inspiratory  Phase scattered coarse rhonchi and expiratory wheezing bilaterally no rales audible heart regular rhythm no S3 or S4 no heaves or rubs abdomen soft nontender bowel sounds normoactive   Investigations:  No results found for this or any previous visit (from the past 240 hour(s)).   Basic Metabolic Panel:  Recent Labs  81/19/1409/06/28 0618 04/15/15 0616  NA 140 140  K 3.2* 3.8  CL 105 105  CO2 27 27  GLUCOSE 160* 146*  BUN 18 22*  CREATININE 0.89 0.86  CALCIUM 9.3 9.0   Liver Function Tests: No results for input(s): AST, ALT, ALKPHOS, BILITOT, PROT, ALBUMIN in the last 72 hours.   CBC:  Recent Labs  04/13/15 1036 04/14/15 0618  WBC 15.3* 24.6*  HGB 15.2 14.6  HCT 44.1 42.6  MCV 98.2 96.8  PLT 247 246    No results found.    Medications:   Impression:  Principal Problem:   COPD with acute exacerbation (HCC) Active Problems:   HLD (hyperlipidemia)   HTN (hypertension)   Continuous tobacco abuse   COPD exacerbation (HCC)     Plan: CBC in a.m. lactic acid at present to rule out evidence of sepsis continue aggressive nebulizer therapy steroids and antibiotics   Consultants:     Procedures   Antibiotics: Rocephin and Zithromax                  Code Status: Full  Family Communication:    Disposition Plan see plan above  Time spent: 30 minutes   LOS: 2 days   Khloe Hunkele  M   04/15/2015, 11:49 AM

## 2015-04-15 NOTE — Progress Notes (Signed)
Dr. Janna Archondiego notified of Tarri FullerFulp, [place] room #327 critical value for Lactic acid, = 3.1

## 2015-04-16 LAB — CBC WITH DIFFERENTIAL/PLATELET
BASOS PCT: 0 %
Basophils Absolute: 0 10*3/uL (ref 0.0–0.1)
Eosinophils Absolute: 0 10*3/uL (ref 0.0–0.7)
Eosinophils Relative: 0 %
HCT: 45.3 % (ref 39.0–52.0)
HEMOGLOBIN: 15.3 g/dL (ref 13.0–17.0)
LYMPHS ABS: 1.8 10*3/uL (ref 0.7–4.0)
Lymphocytes Relative: 7 %
MCH: 33.3 pg (ref 26.0–34.0)
MCHC: 33.8 g/dL (ref 30.0–36.0)
MCV: 98.7 fL (ref 78.0–100.0)
MONOS PCT: 3 %
Monocytes Absolute: 0.8 10*3/uL (ref 0.1–1.0)
NEUTROS ABS: 21.8 10*3/uL — AB (ref 1.7–7.7)
NEUTROS PCT: 89 %
PLATELETS: 263 10*3/uL (ref 150–400)
RBC: 4.59 MIL/uL (ref 4.22–5.81)
RDW: 13.3 % (ref 11.5–15.5)
WBC: 24.5 10*3/uL — AB (ref 4.0–10.5)

## 2015-04-16 LAB — BASIC METABOLIC PANEL
ANION GAP: 8 (ref 5–15)
BUN: 22 mg/dL — ABNORMAL HIGH (ref 6–20)
CALCIUM: 9 mg/dL (ref 8.9–10.3)
CHLORIDE: 104 mmol/L (ref 101–111)
CO2: 25 mmol/L (ref 22–32)
Creatinine, Ser: 0.82 mg/dL (ref 0.61–1.24)
GFR calc non Af Amer: >60 mL/min (ref >60)
Glucose, Bld: 175 mg/dL — ABNORMAL HIGH (ref 65–99)
Potassium: 4.3 mmol/L (ref 3.5–5.1)
SODIUM: 137 mmol/L (ref 135–145)

## 2015-04-16 NOTE — Progress Notes (Signed)
Patient continues to wheeze white count still 24,000 elevated neutrophils some atypical lymphocytes but cultures negative thus far no clinical evidence of septicemia Christopher [name],Christopher [name], Christopher [name]             Physical Exam: Blood pressure 130/71, pulse 94, temperature 98.6 F (37 C), temperature source Oral, resp. rate 18, height 5\' 7"  (1.702 [name]), weight 117 lb 11.2 oz (53.388 kg), SpO2 93 %. lungs show prolonged his return x-ray phase and expiratory wheezing scattered rhonchi no rales appreciable heart regular rhythm no S3 or S4 no heaves rubs abdomen soft nontender bowel sounds normoactive   Investigations:  Recent Results (from the past 240 hour(s))  Culture, blood (routine x 2)     Status: None (Preliminary result)   Collection Time: 04/15/15  3:42 PM  Result Value Ref Range Status   Specimen Description BLOOD LEFT ARM  Final   Special Requests BOTTLES DRAWN AEROBIC AND ANAEROBIC 4CC EACH  Final   Culture NO GROWTH < 24 HOURS  Final   Report Status PENDING  Incomplete  Culture, blood (routine x 2)     Status: None (Preliminary result)   Collection Time: 04/15/15  3:45 PM  Result Value Ref Range Status   Specimen Description BLOOD LEFT HAND  Final   Special Requests BOTTLES DRAWN AEROBIC ONLY 4CC  Final   Culture NO GROWTH < 24 HOURS  Final   Report Status PENDING  Incomplete     Basic Metabolic Panel:  Recent Labs  81/19/1409/07/29 0616 04/16/15 0625  NA 140 137  K 3.8 4.3  CL 105 104  CO2 27 25  GLUCOSE 146* 175*  BUN 22* 22*  CREATININE 0.86 0.82  CALCIUM 9.0 9.0   Liver Function Tests: No results for input(s): AST, ALT, ALKPHOS, BILITOT, PROT, ALBUMIN in the last 72 hours.   CBC:  Recent Labs  04/14/15 0618 04/16/15 0625  WBC 24.6* 24.5*  NEUTROABS  --  21.8*  HGB 14.6 15.3  HCT 42.6 45.3  MCV 96.8 98.7  PLT 246 263    No results found.    Medications:   Impression:  Principal  Problem:   COPD with acute exacerbation (HCC) Active Problems:   HLD (hyperlipidemia)   HTN (hypertension)   Continuous tobacco abuse   COPD exacerbation (HCC)     Plan: CBC in a.[name]. await blood cultures continue dual antibiotics  Consultants:     Procedures   Antibiotics: Rocephin and Zithromax                  Code Status: Full  Family Communication:    Disposition Plan see plan above  Time spent: 30 minutes   LOS: 3 days   Christopher [name]   04/16/2015, 1:10 PM

## 2015-04-17 LAB — CBC WITH DIFFERENTIAL/PLATELET
Basophils Absolute: 0.1 10*3/uL (ref 0.0–0.1)
Basophils Relative: 0 %
EOS PCT: 0 %
Eosinophils Absolute: 0 10*3/uL (ref 0.0–0.7)
HEMATOCRIT: 42.8 % (ref 39.0–52.0)
HEMOGLOBIN: 14.7 g/dL (ref 13.0–17.0)
Lymphocytes Relative: 13 %
Lymphs Abs: 2.1 10*3/uL (ref 0.7–4.0)
MCH: 33.3 pg (ref 26.0–34.0)
MCHC: 34.3 g/dL (ref 30.0–36.0)
MCV: 97.1 fL (ref 78.0–100.0)
MONO ABS: 0.8 10*3/uL (ref 0.1–1.0)
Monocytes Relative: 5 %
Neutro Abs: 13.8 10*3/uL — ABNORMAL HIGH (ref 1.7–7.7)
Neutrophils Relative %: 82 %
Platelets: 252 10*3/uL (ref 150–400)
RBC: 4.41 MIL/uL (ref 4.22–5.81)
RDW: 13 % (ref 11.5–15.5)
WBC: 16.8 10*3/uL — ABNORMAL HIGH (ref 4.0–10.5)

## 2015-04-17 LAB — BASIC METABOLIC PANEL
ANION GAP: 8 (ref 5–15)
BUN: 20 mg/dL (ref 6–20)
CALCIUM: 9.2 mg/dL (ref 8.9–10.3)
CO2: 28 mmol/L (ref 22–32)
Chloride: 103 mmol/L (ref 101–111)
Creatinine, Ser: 0.76 mg/dL (ref 0.61–1.24)
GFR calc Af Amer: >60 mL/min (ref >60)
GLUCOSE: 135 mg/dL — AB (ref 65–99)
Potassium: 4.1 mmol/L (ref 3.5–5.1)
Sodium: 139 mmol/L (ref 135–145)

## 2015-04-17 MED ORDER — NICOTINE 21 MG/24HR TD PT24: 21.0000 mg | MEDICATED_PATCH | Freq: Every day | TRANSDERMAL | Status: AC

## 2015-04-17 MED ORDER — PREDNISONE 20 MG PO TABS: 20.0000 mg | ORAL_TABLET | Freq: Every day | ORAL | Status: DC

## 2015-04-17 MED ORDER — PREDNISONE 20 MG PO TABS: 20.0000 mg | ORAL_TABLET | Freq: Every day | ORAL | Status: AC

## 2015-04-17 MED ORDER — VARENICLINE TARTRATE 1 MG PO TABS: 1.0000 mg | ORAL_TABLET | Freq: Two times a day (BID) | ORAL | Status: AC

## 2015-04-17 NOTE — Discharge Summary (Signed)
Physician Discharge Summary  Christopher [name] [facility] ZOX:[id number] DOB: 06/13/1941 DOA: 04/13/2015  PCP: Isabella Stalling, MD  Admit date: 04/13/2015 Discharge date: 04/17/2015   Recommendations for Outpatient Follow-up:  Patient is advised to not smoke cigarettes to take Chantix and nicotine dermis 21 mg daily patch take all current medicines follow my office in 3 days' time Discharge Diagnoses:  Principal Problem:   COPD with acute exacerbation (HCC) Active Problems:   HLD (hyperlipidemia)   HTN (hypertension)   Continuous tobacco abuse   COPD exacerbation [facility])   Discharge Condition: Good  Filed Weights   04/13/15 1006 04/13/15 1450  Weight: 120 lb (54.432 kg) 117 lb 11.2 oz (53.388 kg)    History of present illness:  Patient has severe end-stage COPD 2 pack per day smoker admitted with cough and increasing dyspnea COPD exacerbation chest x-ray on admission revealed questionable right lower lobe infiltrate is treated for community-acquired pneumonia with Rocephin and Zithromax. On IV steroids his wheezing was so controlled for 5 day. Subsequently discharged on oral steroids antibiotics were sufficient and formerly had Zithromax this will be prescribed Chantix as well as NicoDerm CQ 21 mg patch daily he was strongly advised not to smoke cigarettes and to follow-up my office in 3 days' time  Hospital Course:  See history of present illness  Procedures:     Consultations:    Discharge Instructions     Medication List    STOP taking these medications        acetaminophen 500 MG tablet  Commonly known as:  TYLENOL     vitamin C 1000 MG tablet      TAKE these medications        ALPRAZolam 0.5 MG tablet  Commonly known as:  XANAX  Take 0.5 mg by mouth 3 (three) times daily.     amLODipine 10 MG tablet  Commonly known as:  NORVASC  Take 10 mg by mouth daily.     atorvastatin 20 MG tablet  Commonly known as:  LIPITOR  Take 20 mg by mouth daily at 6 PM.     nicotine 21 mg/24hr patch  Commonly known as:  NICODERM CQ - dosed in mg/24 hours  Place 1 patch (21 mg total) onto the skin daily.     oxyCODONE-acetaminophen 5-325 MG tablet  Commonly known as:  PERCOCET/ROXICET  Take 1 tablet by mouth every 6 (six) hours as needed for moderate pain.     varenicline 1 MG tablet  Commonly known as:  CHANTIX CONTINUING MONTH PAK  Take 1 tablet (1 mg total) by mouth 2 (two) times daily.      ASK your doctor about these medications        VENTOLIN HFA 108 (90 BASE) MCG/ACT inhaler  Generic drug:  albuterol       No Known Allergies    The results of significant diagnostics from this hospitalization (including imaging, microbiology, ancillary and laboratory) are listed below for reference.    Significant Diagnostic Studies: Dg Chest Portable 1 View  04/13/2015  CLINICAL DATA:  Shortness of breath, cough EXAM: PORTABLE CHEST 1 VIEW COMPARISON:  11/29/2013 FINDINGS: There is hazy right lower lobe airspace disease. There is no other focal parenchymal opacity. There is no pleural effusion or pneumothorax. The heart and mediastinal contours are unremarkable. The osseous structures are unremarkable. IMPRESSION: Hazy right lower lobe airspace disease concerning for pneumonia appear Electronically Signed   By: Elige Ko   On: 04/13/2015 10:37  Microbiology: Recent Results (from the past 240 hour(s))  Culture, blood (routine x 2)     Status: None (Preliminary result)   Collection Time: 04/15/15  3:42 PM  Result Value Ref Range Status   Specimen Description BLOOD LEFT ARM  Final   Special Requests BOTTLES DRAWN AEROBIC AND ANAEROBIC 4CC EACH  Final   Culture NO GROWTH 2 DAYS  Final   Report Status PENDING  Incomplete  Culture, blood (routine x 2)     Status: None (Preliminary result)   Collection Time: 04/15/15  3:45 PM  Result Value Ref Range Status   Specimen Description BLOOD LEFT HAND  Final   Special Requests BOTTLES DRAWN AEROBIC ONLY 4CC   Final   Culture NO GROWTH 2 DAYS  Final   Report Status PENDING  Incomplete     Labs: Basic Metabolic Panel:  Recent Labs Lab 04/13/15 1036 04/14/15 0618 04/15/15 0616 04/16/15 0625 04/17/15 0615  NA 135 140 140 137 139  K 3.7 3.2* 3.8 4.3 4.1  CL 100* 105 105 104 103  CO2 24 27 27 25 28   GLUCOSE 189* 160* 146* 175* 135*  BUN 14 18 22* 22* 20  CREATININE 1.04 0.89 0.86 0.82 0.76  CALCIUM 9.1 9.3 9.0 9.0 9.2   Liver Function Tests: No results for input(s): AST, ALT, ALKPHOS, BILITOT, PROT, ALBUMIN in the last 168 hours. No results for input(s): LIPASE, AMYLASE in the last 168 hours. No results for input(s): AMMONIA in the last 168 hours. CBC:  Recent Labs Lab 04/13/15 1036 04/14/15 0618 04/16/15 0625 04/17/15 0615  WBC 15.3* 24.6* 24.5* 16.8*  NEUTROABS  --   --  21.8* 13.8*  HGB 15.2 14.6 15.3 14.7  HCT 44.1 42.6 45.3 42.8  MCV 98.2 96.8 98.7 97.1  PLT 247 246 263 252   Cardiac Enzymes:  Recent Labs Lab 04/13/15 1036  TROPONINI <0.03   BNP: BNP (last 3 results) No results for input(s): BNP in the last 8760 hours.  ProBNP (last 3 results) No results for input(s): PROBNP in the last 8760 hours.  CBG: No results for input(s): GLUCAP in the last 168 hours.     Signed:  Dante Cooter Judie PetitM  [facility] Pager: [phone] 04/17/2015, 12:54 PM

## 2015-04-17 NOTE — Discharge Planning (Signed)
Pt IV and tele removed.  Pt DC papers print, given, explained and educated.  Pt given scripts and sent prednisone directly to pharm.  VSS and RN assessment revealed stability for DC to home. Pt will be walked to front and family will be taking home via car.

## 2015-04-17 NOTE — Care Management Note (Signed)
Case Management Note  Patient Details  Name: Christopher [name] MRN: [id number] Date of Birth: 10/19/1940  Expected Discharge Date:                  Expected Discharge Plan:  Home/Self Care  In-House Referral:  NA  Discharge planning Services  CM Consult  Post Acute Care Choice:  NA Choice offered to:  NA  DME Arranged:    DME Agency:     HH Arranged:    HH Agency:     Status of Service:  Completed, signed off  Medicare Important Message Given:  Yes-second notification given Date Medicare IM Given:    Medicare IM give by:    Date Additional Medicare IM Given:    Additional Medicare Important Message give by:     If discussed at Long Length of Stay Meetings, dates discussed:    Additional Comments: Pt discharged home with self care today. No CM needs.  Malcolm Metrohildress, Khyri Hinzman Demske, RN 04/17/2015, 2:04 PM

## 2015-04-17 NOTE — Care Management Important Message (Signed)
Important Message  Patient Details  Name: Christopher [name] MRN: [id number] Date of Birth: 01/30/1941   Medicare Important Message Given:  Yes-second notification given    Malcolm MetroChildress, Kimberla Driskill Demske, RN 04/17/2015, 2:04 PM

## 2015-04-18 LAB — URINE CULTURE
CULTURE: NO GROWTH
Special Requests: NORMAL

## 2015-04-20 LAB — CULTURE, BLOOD (ROUTINE X 2)
CULTURE: NO GROWTH
Culture: NO GROWTH

## 2015-11-26 ENCOUNTER — Other Ambulatory Visit (HOSPITAL_COMMUNITY): Payer: Self-pay | Admitting: Family Medicine

## 2015-11-26 ENCOUNTER — Ambulatory Visit (HOSPITAL_COMMUNITY)
Admission: RE | Admit: 2015-11-26 | Discharge: 2015-11-26 | Disposition: A | Discharge status: Home / Self Care | Payer: Medicare Other | Source: Ambulatory Visit | Attending: Family Medicine | Admitting: Family Medicine

## 2015-11-26 DIAGNOSIS — J189 Pneumonia, unspecified organism: Secondary | ICD-10-CM

## 2015-11-26 DIAGNOSIS — R918 Other nonspecific abnormal finding of lung field: Secondary | ICD-10-CM | POA: Insufficient documentation

## 2018-07-07 ENCOUNTER — Encounter (HOSPITAL_COMMUNITY): Payer: Self-pay | Admitting: Emergency Medicine

## 2018-07-07 ENCOUNTER — Emergency Department (HOSPITAL_COMMUNITY): Payer: Medicare Other

## 2018-07-07 ENCOUNTER — Other Ambulatory Visit: Payer: Self-pay

## 2018-07-07 ENCOUNTER — Emergency Department (HOSPITAL_COMMUNITY)
Admission: EM | Admit: 2018-07-07 | Discharge: 2018-07-07 | Disposition: A | Discharge status: Home / Self Care | Payer: Medicare Other | Attending: Emergency Medicine | Admitting: Emergency Medicine

## 2018-07-07 DIAGNOSIS — M25512 Pain in left shoulder: Secondary | ICD-10-CM | POA: Diagnosis present

## 2018-07-07 DIAGNOSIS — J449 Chronic obstructive pulmonary disease, unspecified: Secondary | ICD-10-CM | POA: Insufficient documentation

## 2018-07-07 DIAGNOSIS — Z79899 Other long term (current) drug therapy: Secondary | ICD-10-CM | POA: Diagnosis not present

## 2018-07-07 DIAGNOSIS — X500XXA Overexertion from strenuous movement or load, initial encounter: Secondary | ICD-10-CM | POA: Insufficient documentation

## 2018-07-07 DIAGNOSIS — I10 Essential (primary) hypertension: Secondary | ICD-10-CM | POA: Insufficient documentation

## 2018-07-07 DIAGNOSIS — T1490XA Injury, unspecified, initial encounter: Secondary | ICD-10-CM

## 2018-07-07 DIAGNOSIS — F17210 Nicotine dependence, cigarettes, uncomplicated: Secondary | ICD-10-CM | POA: Diagnosis not present

## 2018-07-07 NOTE — ED Notes (Signed)
Pt left with out his discharge instructions

## 2018-07-07 NOTE — Discharge Instructions (Addendum)
Your xray today was normal, I have provided a copy of the xray for your records. Please apply heat or ice to the area to help with symptoms.   Place a towel between your skin and the heat source. Leave the heat on for 20-30 minutes. Remove the heat if your skin turns bright red. This is especially important if you are unable to feel pain, heat, or cold. You may have a greater risk of getting burned.  If you experience any chest pain, shortness of breath, tingling of your left arm please return to the ED. Otherwise follow up with your orthopedist on Monday at your scheduled appointment.

## 2018-07-07 NOTE — ED Provider Notes (Signed)
[facility] EMERGENCY DEPARTMENT Provider Note   CSN: [id number] Arrival date & time: 07/07/18  1429     History   Chief Complaint Chief Complaint  Patient presents with  . Shoulder Pain    HPI Christopher [name] is a 78 y.o. male.  78 y.o male with a PMH of HTN, COPD, Hypercholesteremia presents to the ED with a chief complaint of left shoulder pain. Patient reports he was lifting a "heavy window panel" trying to install it when he felt a "pop" on his shoulder joint. He reports ever sense he's got left shoulder joint pain which is worse with movement and lifting. He has taken oxycodone 5 for the pain but reports no improvement in symptoms. He reports taking a hot shower this morning and being able to move his shoulder after the shower. He denies any radiation of pain down his arm. He reports no pain or complaint with his elbow or hand. He reports taking BC powder for relieve as well.Patient has an appointment schedule with his orthopedist on Monday 27 of January. He denies any chest pain, shortness of breath, tingling to the arm or any trauma.      Past Medical History:  Diagnosis Date  . COPD (chronic obstructive pulmonary disease) (HCC)   . HTN (hypertension)   . Hypercholesterolemia     Patient Active Problem List   Diagnosis Date Noted  . COPD with acute exacerbation (HCC) 04/13/2015  . HLD (hyperlipidemia) 04/13/2015  . HTN (hypertension) 04/13/2015  . Continuous tobacco abuse 04/13/2015  . COPD exacerbation (HCC) 04/13/2015    Past Surgical History:  Procedure Laterality Date  . FACIAL FRACTURE SURG[facility]     [facility] age of 78 [facility]got [facility]  . LEG SURGERY     Right ; Rod        Home Medications    Prior to Admission medications   Medication Sig Start Date End Date Taking? Authorizing Provider  ALPRAZolam Prudy Feeler(XANAX) 0.5 MG tablet Take 0.5 mg by mouth 3 (three) times daily.  04/09/15   [provider]  amLODipine (NORVASC) 10 MG tablet Take 10 mg by  mouth daily.  04/09/15   [provider]  atorvastatin (LIPITOR) 20 MG tablet Take 20 mg by mouth daily at 6 PM.  04/09/15   [provider]  nicotine (NICODERM CQ - DOSED IN MG/24 HOURS) 21 mg/24hr patch Place 1 patch (21 mg total) onto the skin daily. 04/17/15   Oval Linseyondiego, Richard, MD  oxyCODONE-acetaminophen (PERCOCET/ROXICET) 5-325 MG tablet Take 1 tablet by mouth every 6 (six) hours as needed for moderate pain.  04/09/15   [provider]  predniSONE (DELTASONE) 20 MG tablet Take 1 tablet (20 mg total) by mouth daily with breakfast. 04/18/15   Oval Linseyondiego, Richard, MD  varenicline (CHANTIX CONTINUING MONTH PAK) 1 MG tablet Take 1 tablet (1 mg total) by mouth 2 (two) times daily. 04/17/15   Oval Linseyondiego, Richard, MD  VENTOLIN HFA 108 (90 BASE) MCG/ACT inhaler  04/09/15   [provider]    Family History History reviewed. No pertinent family history.  Social History Social History   Tobacco Use  . Smoking status: Current Every Day Smoker    Packs/day: 1.00    Types: Cigarettes  . Smokeless tobacco: Never Used  Substance Use Topics  . Alcohol use: No  . Drug use: No     Allergies   Patient has no known allergies.   Review of Systems Review of Systems  Constitutional: Negative  for fever.  Musculoskeletal: Positive for arthralgias and myalgias.     Physical Exam Updated Vital Signs BP (!) 149/85 (BP Location: Right Arm)   Pulse 99   Temp 98.7 F (37.1 C) (Oral)   Resp 18   Ht 5\' 6"  (1.676 m)   Wt 59 kg   SpO2 98%   BMI 20.98 kg/m   Physical Exam Vitals signs and nursing note reviewed.  Constitutional:      Appearance: He is well-developed.  HENT:     Head: Normocephalic and atraumatic.  Eyes:     General: No scleral icterus.    Pupils: Pupils are equal, round, and reactive to light.  Neck:     Musculoskeletal: Normal range of motion.  Cardiovascular:     Heart sounds: Normal heart sounds.  Pulmonary:     Effort: Pulmonary  effort is normal.     Breath sounds: Normal breath sounds. No wheezing.  Chest:     Chest wall: No tenderness.  Abdominal:     General: Bowel sounds are normal. There is no distension.     Palpations: Abdomen is soft.     Tenderness: There is no abdominal tenderness.  Musculoskeletal:        General: No deformity.     Right shoulder: Normal.     Left shoulder: He exhibits decreased range of motion, tenderness and pain. He exhibits no swelling, no effusion, no crepitus, normal pulse and normal strength.     Comments: Pulses present. Capillary refill intact. Strength 5/5, limited ROM due to pain. Patient able to extend arm to 45 degrees unable to 90 degrees.   Skin:    General: Skin is warm and dry.     Comments: No rash, discoloration, erythema of the left shoulder.   Neurological:     Mental Status: He is alert and oriented to person, place, and time.      ED Treatments / Results  Labs (all labs ordered are listed, but only abnormal results are displayed) Labs Reviewed - No data to display  EKG None  Radiology Dg Shoulder Left  Result Date: 07/07/2018 CLINICAL DATA:  Left shoulder pain, no known injury, initial encounter EXAM: LEFT SHOULDER - 3  VIEW COMPARISON:  None. FINDINGS: Degenerative changes of the acromioclavicular joint are seen. Degenerative glenohumeral changes are noted as well. No acute fracture or dislocation is noted. The underlying bony thorax is within normal limits. IMPRESSION: Mild degenerative changes without acute abnormality. Electronically Signed   By: Alcide Clever M.D.   On: 07/07/2018 16:05    Procedures Procedures (including critical care time)  Medications Ordered in ED Medications - No data to display   Initial Impression / Assessment and Plan / ED Course  I have reviewed the triage vital signs and the nursing notes.  Pertinent labs & imaging results that were available during my care of the patient were reviewed by me and considered in my  medical decision making (see chart for details).    Patient with PMH of HTN, HLD, COPD presents to the ED with a chief complaint of left shoulder pain after placing heavy windows on Thursday.  Elevation there is limited range of motion on left shoulder joint, he denies any weakness to the joint or tingling sensation to his left arm.  Patient has about 45 degrees of mobility on left shoulder.  He reports after having a hot shower this morning his symptoms improved he was able to fully extend his shoulder at 90 degrees,  some suspicion for adhesive capsulitis as patient will to move shoulder after a hot shower.  Pulses are present,capillary refill intact.  Evette Cristal is not febrile, low suspicion for septic joint.  Heart rate slightly elevated will obtain EKG to rule out any other acute process.  Patient is well-appearing, he showed no changes consistent with infarct or STEMI.  At this time will discharge patient to follow-up with orthopedist, he is advised to try hot baths along with ice to his shoulder.  He currently has oxycodone fives at home will not be prescribing oral pain medication at this time.  Patient stable for discharge with stable vital signs.     Final Clinical Impressions(s) / ED Diagnoses   Final diagnoses:  Acute pain of left shoulder    ED Discharge Orders    None       Claude Manges, PA-C 07/07/18 1700    Loren Racer, MD 07/09/18 2306

## 2018-07-07 NOTE — ED Triage Notes (Signed)
Pt reports R shoulder pain after putting in new windows on Thursday.

## 2019-10-27 ENCOUNTER — Emergency Department (HOSPITAL_COMMUNITY)
Admission: EM | Admit: 2019-10-27 | Discharge: 2019-10-27 | Disposition: A | Discharge status: Home / Self Care | Payer: Medicare Other | Attending: Emergency Medicine | Admitting: Emergency Medicine

## 2019-10-27 ENCOUNTER — Encounter (HOSPITAL_COMMUNITY): Payer: Self-pay | Admitting: Emergency Medicine

## 2019-10-27 ENCOUNTER — Other Ambulatory Visit: Payer: Self-pay

## 2019-10-27 ENCOUNTER — Emergency Department (HOSPITAL_COMMUNITY): Payer: Medicare Other

## 2019-10-27 DIAGNOSIS — M25461 Effusion, right knee: Secondary | ICD-10-CM | POA: Insufficient documentation

## 2019-10-27 DIAGNOSIS — M25561 Pain in right knee: Secondary | ICD-10-CM | POA: Insufficient documentation

## 2019-10-27 DIAGNOSIS — J449 Chronic obstructive pulmonary disease, unspecified: Secondary | ICD-10-CM | POA: Diagnosis not present

## 2019-10-27 DIAGNOSIS — I10 Essential (primary) hypertension: Secondary | ICD-10-CM | POA: Diagnosis not present

## 2019-10-27 DIAGNOSIS — F17210 Nicotine dependence, cigarettes, uncomplicated: Secondary | ICD-10-CM | POA: Insufficient documentation

## 2019-10-27 MED ORDER — ACETAMINOPHEN 325 MG PO TABS
650.0000 mg | ORAL_TABLET | Freq: Once | ORAL | Status: AC
  Administered 2019-10-27: 650 mg via ORAL
  Filled 2019-10-27: qty 2
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
## 2019-10-27 NOTE — ED Triage Notes (Signed)
Playing football with grandson last night fell twisting his R knee   Has had a knee replacement "long time ago" At [facility]  Today he can hardly walk and knee is painful

## 2019-10-27 NOTE — Discharge Instructions (Addendum)
Your x-rays and CT did not show any new fracture to your knee, your previous surgical repair is intact. Does show some swelling and fluid on the knee likely from your injury. Wear knee sleeve, ice and elevate the knee, take Tylenol as needed, you can use a cane as needed when walking. Follow-up with your primary care doctor if pain is not improving.

## 2020-11-25 ENCOUNTER — Telehealth: Payer: Self-pay

## 2020-11-25 NOTE — Telephone Encounter (Signed)
New Patient-No per Patel 

## 2021-02-01 IMAGING — DX DG KNEE COMPLETE 4+V*R*
4 series · 4 of 4 positions shown · non-contrast
Comparison: None.

CLINICAL DATA: Fall, twisting injury. Remote history of patellar
surgery

EXAM:
RIGHT KNEE - COMPLETE 4+ VIEW

[knee ap]
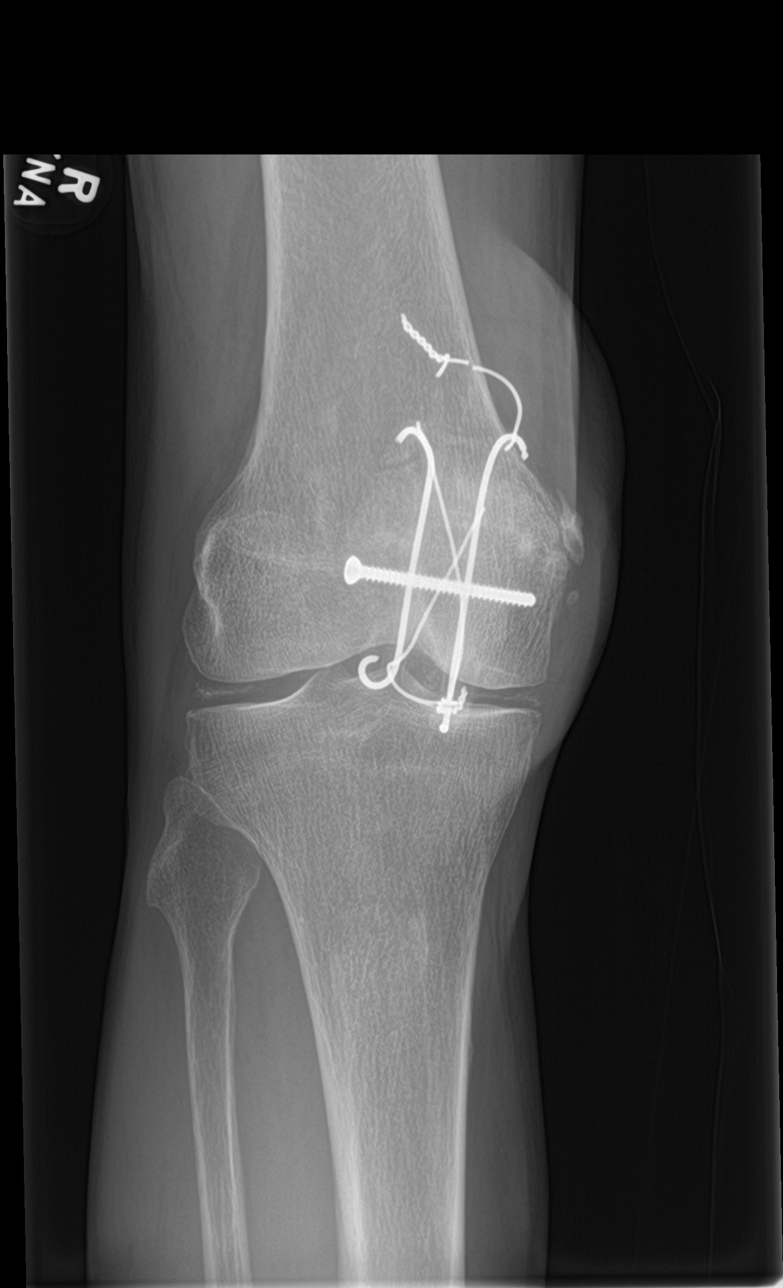

[knee obl (1 of 2)]
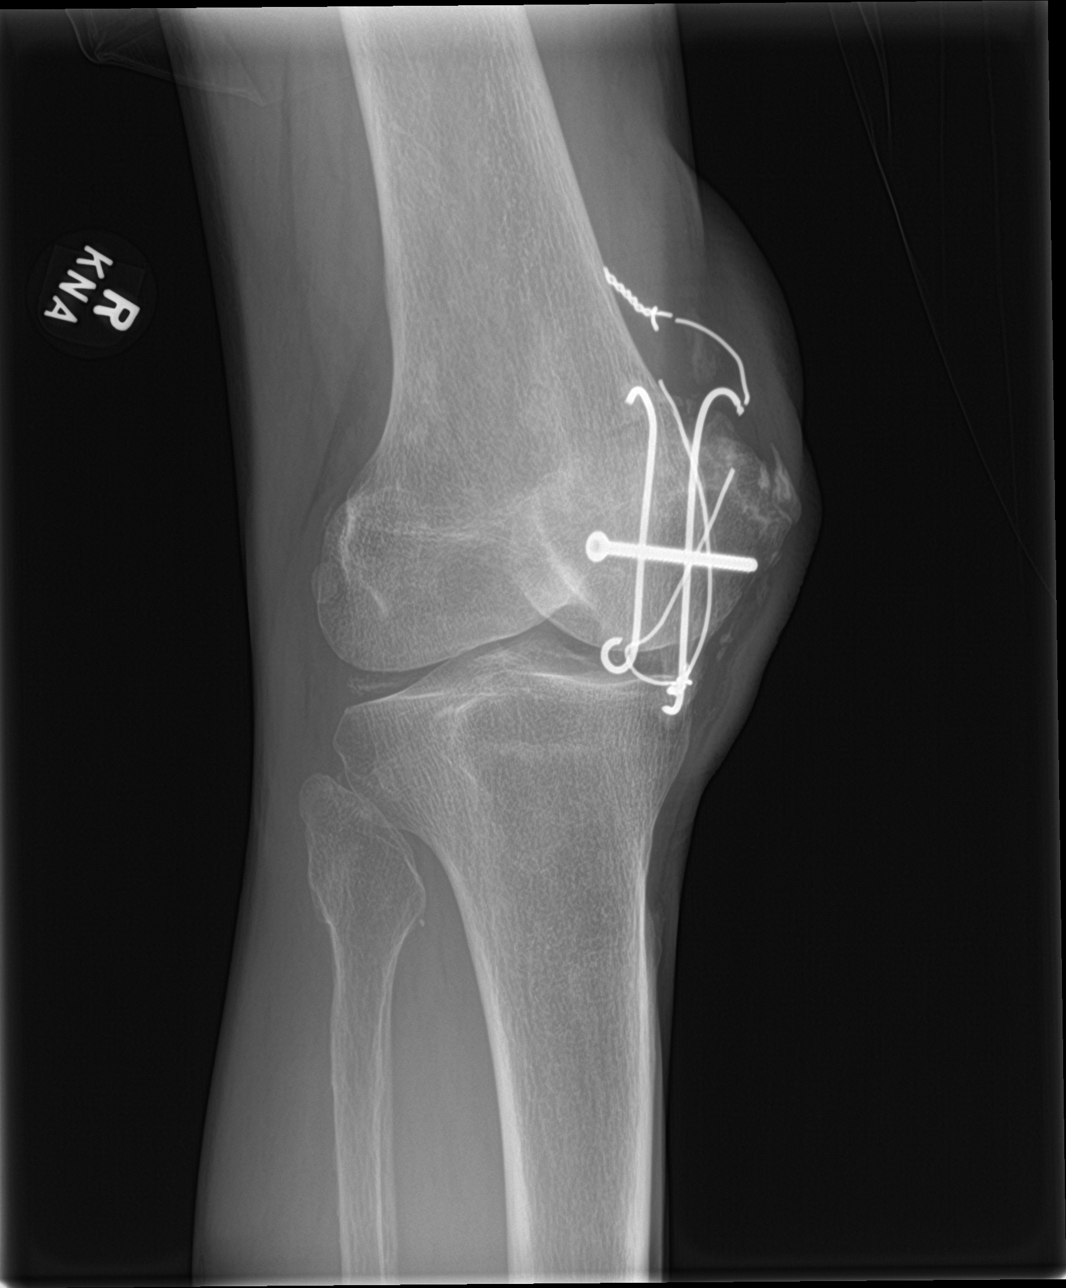

[knee obl (2 of 2)]
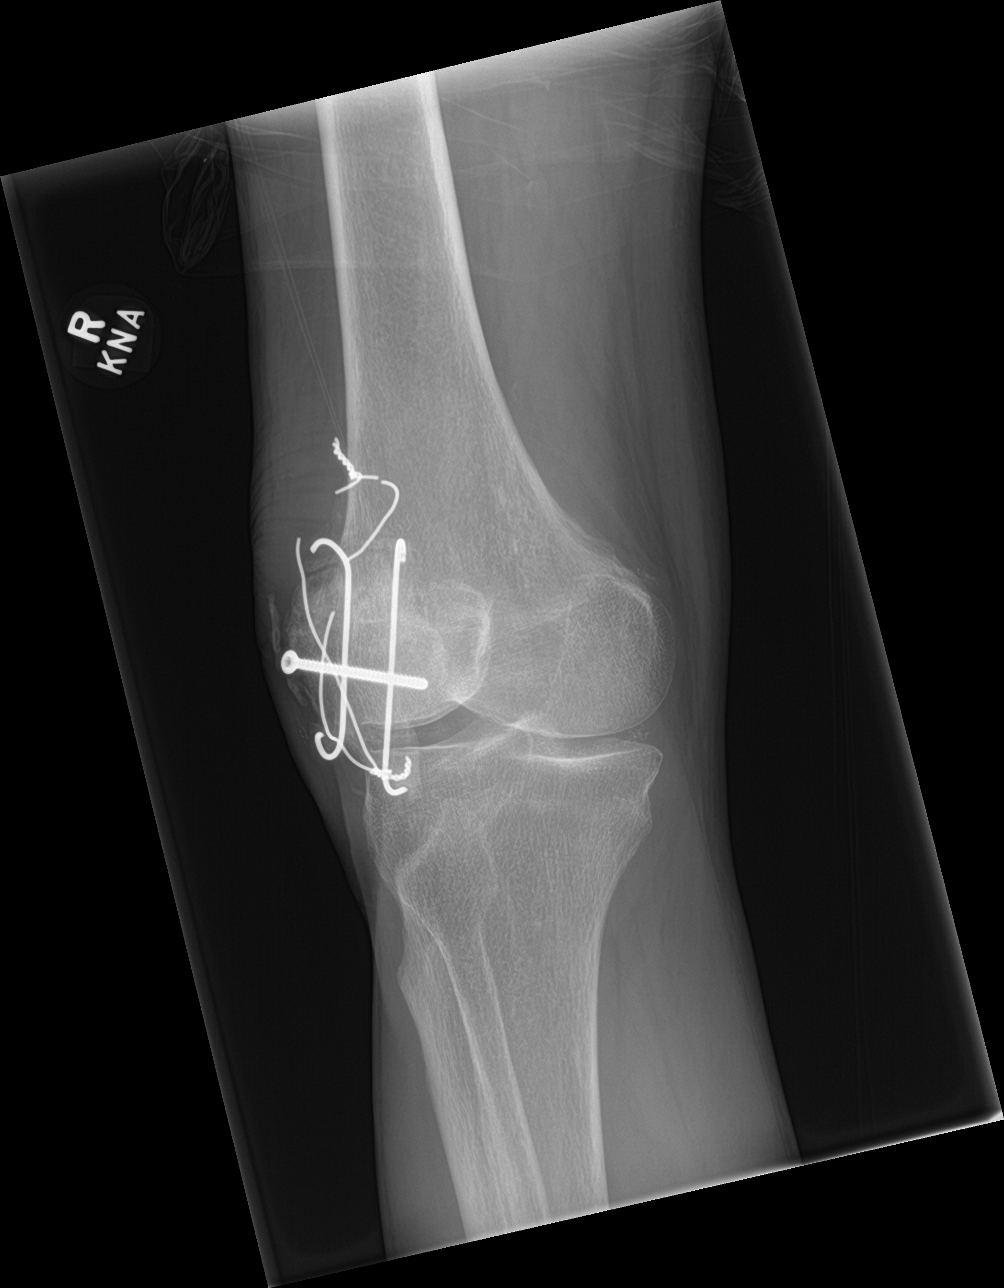

[knee lat]
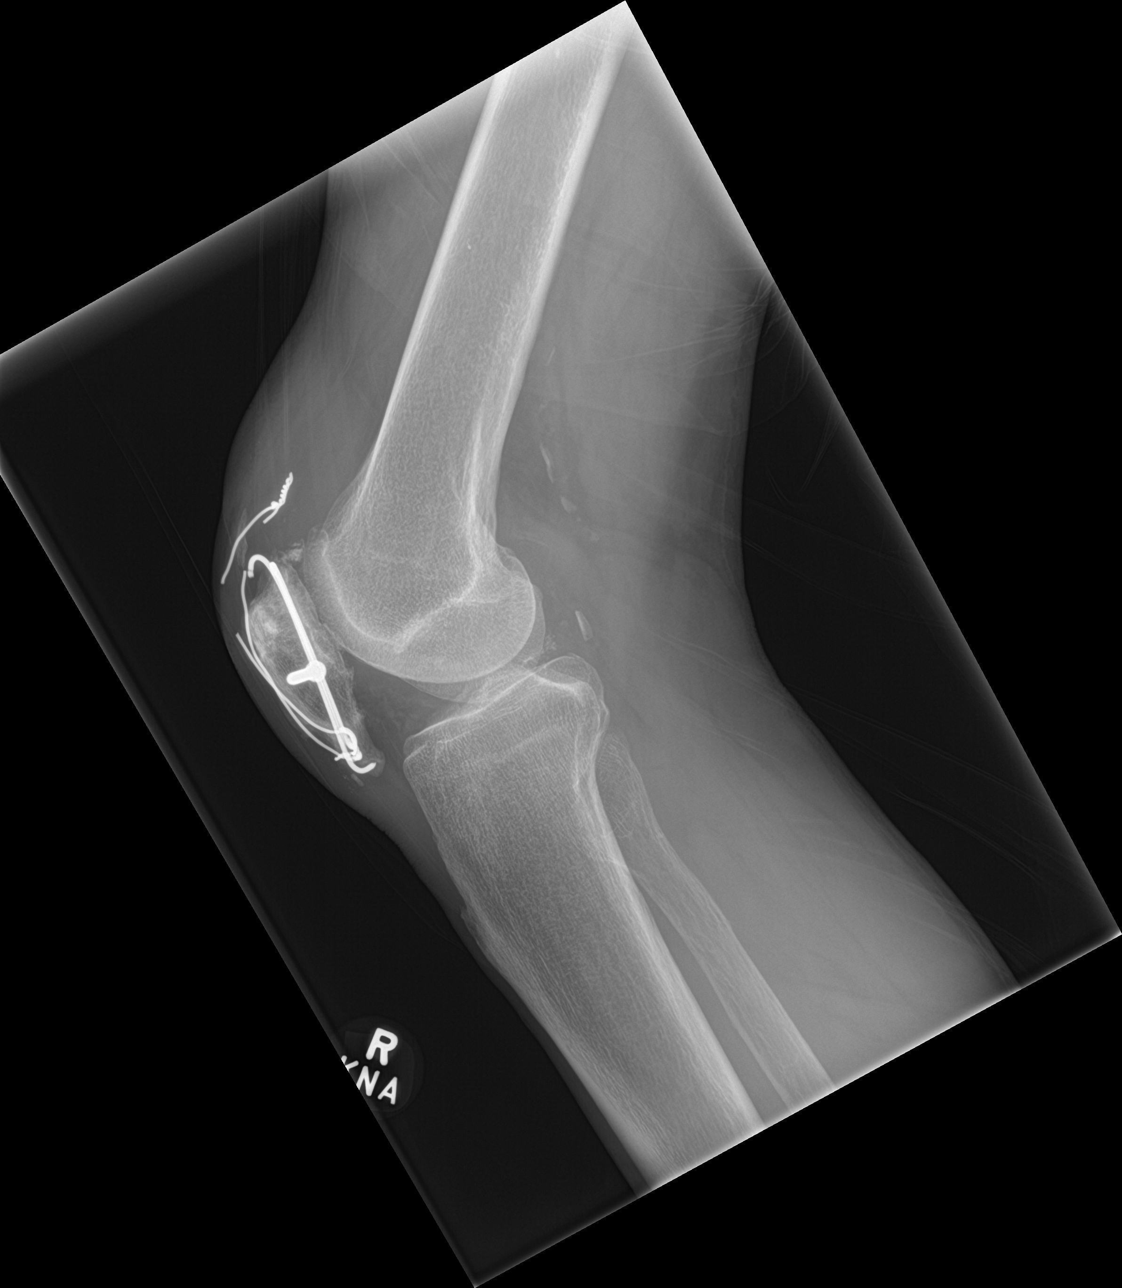

[4 of 4 positions shown; findings below may reference images not displayed]

FINDINGS: Prior patellar ORIF with fractured hardware wires, age
indeterminate. Probable bipartite patella. There is also some
fragmentation of the medial patella, which appears chronic. There is
a moderate-sized knee joint effusion without fat fluid level. Mild
tricompartmental osteoarthritis with chondrocalcinosis. Scattered
vascular calcifications.
IMPRESSION: 1. Osseous irregularity of the medial patella, which appears to be
chronic. Correlate with point tenderness at this location.
2. Moderate knee joint effusion without evidence of
lipohemarthrosis.
3. Prior patellar ORIF with fractured hardware wires, age
indeterminate.
4. Mild tricompartmental osteoarthritis with chondrocalcinosis.
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
## 2022-03-18 ENCOUNTER — Encounter (HOSPITAL_COMMUNITY)
Admission: RE | Admit: 2022-03-18 | Discharge: 2022-03-18 | Disposition: A | Discharge status: Home / Self Care | Payer: Medicare Other | Source: Ambulatory Visit | Attending: Optometry | Admitting: Optometry

## 2022-03-18 NOTE — Pre-Procedure Instructions (Signed)
Attempted pre-op phone call. Left VM for him to call us back. 

## 2022-03-21 ENCOUNTER — Encounter (HOSPITAL_COMMUNITY): Payer: Self-pay

## 2022-03-21 ENCOUNTER — Other Ambulatory Visit: Payer: Self-pay

## 2022-03-25 ENCOUNTER — Encounter (HOSPITAL_COMMUNITY): Payer: Self-pay | Admitting: Optometry

## 2022-03-25 ENCOUNTER — Other Ambulatory Visit: Payer: Self-pay

## 2022-03-25 ENCOUNTER — Encounter (HOSPITAL_COMMUNITY)
Admission: RE | Disposition: A | Discharge status: Home / Self Care | Payer: Self-pay | Source: Home / Self Care | Attending: Optometry

## 2022-03-25 ENCOUNTER — Ambulatory Visit (HOSPITAL_COMMUNITY)
Admission: RE | Admit: 2022-03-25 | Discharge: 2022-03-25 | Disposition: A | Discharge status: Home / Self Care | Payer: Medicare Other | Attending: Optometry | Admitting: Optometry

## 2022-03-25 ENCOUNTER — Ambulatory Visit (HOSPITAL_BASED_OUTPATIENT_CLINIC_OR_DEPARTMENT_OTHER): Payer: Medicare Other | Admitting: Anesthesiology

## 2022-03-25 ENCOUNTER — Ambulatory Visit (HOSPITAL_COMMUNITY): Payer: Medicare Other | Admitting: Anesthesiology

## 2022-03-25 DIAGNOSIS — H25812 Combined forms of age-related cataract, left eye: Secondary | ICD-10-CM | POA: Diagnosis present

## 2022-03-25 DIAGNOSIS — J449 Chronic obstructive pulmonary disease, unspecified: Secondary | ICD-10-CM | POA: Diagnosis not present

## 2022-03-25 DIAGNOSIS — H11041 Peripheral pterygium, stationary, right eye: Secondary | ICD-10-CM | POA: Diagnosis not present

## 2022-03-25 DIAGNOSIS — H04122 Dry eye syndrome of left lacrimal gland: Secondary | ICD-10-CM | POA: Insufficient documentation

## 2022-03-25 DIAGNOSIS — H35363 Drusen (degenerative) of macula, bilateral: Secondary | ICD-10-CM | POA: Diagnosis not present

## 2022-03-25 DIAGNOSIS — H5203 Hypermetropia, bilateral: Secondary | ICD-10-CM | POA: Insufficient documentation

## 2022-03-25 DIAGNOSIS — H2181 Floppy iris syndrome: Secondary | ICD-10-CM | POA: Diagnosis not present

## 2022-03-25 DIAGNOSIS — I10 Essential (primary) hypertension: Secondary | ICD-10-CM | POA: Diagnosis not present

## 2022-03-25 DIAGNOSIS — Z87891 Personal history of nicotine dependence: Secondary | ICD-10-CM

## 2022-03-25 HISTORY — PX: CATARACT EXTRACTION W/PHACO: SHX586

## 2022-03-25 SURGERY — PHACOEMULSIFICATION, CATARACT, WITH IOL INSERTION
Anesthesia: Monitor Anesthesia Care | Site: Eye | Laterality: Left

## 2022-03-25 MED ORDER — SODIUM CHLORIDE 0.9% FLUSH
INTRAVENOUS | Status: DC | PRN
  Administered 2022-03-25: 3 mL via INTRAVENOUS

## 2022-03-25 MED ORDER — MOXIFLOXACIN HCL 0.5 % OP SOLN
OPHTHALMIC | Status: DC | PRN
  Administered 2022-03-25: 2 [drp] via OPHTHALMIC

## 2022-03-25 MED ORDER — LIDOCAINE HCL 3.5 % OP GEL: 1.0000 | Freq: Once | OPHTHALMIC | Status: DC

## 2022-03-25 MED ORDER — PHENYLEPHRINE-KETOROLAC 1-0.3 % IO SOLN
INTRAOCULAR | Status: DC | PRN
  Administered 2022-03-25: 500 mL via OPHTHALMIC

## 2022-03-25 MED ORDER — EPINEPHRINE PF 1 MG/ML IJ SOLN
INTRAMUSCULAR | Status: AC
  Filled 2022-03-25: qty 1

## 2022-03-25 MED ORDER — MIDAZOLAM HCL 5 MG/5ML IJ SOLN
INTRAMUSCULAR | Status: DC | PRN
  Administered 2022-03-25: 1 mg via INTRAVENOUS

## 2022-03-25 MED ORDER — TROPICAMIDE 1 % OP SOLN
1.0000 [drp] | OPHTHALMIC | Status: AC | PRN
  Administered 2022-03-25 (×3): 1 [drp] via OPHTHALMIC
  Filled 2022-03-25: qty 3

## 2022-03-25 MED ORDER — FENTANYL CITRATE (PF) 100 MCG/2ML IJ SOLN
INTRAMUSCULAR | Status: DC | PRN
  Administered 2022-03-25: 50 ug via INTRAVENOUS

## 2022-03-25 MED ORDER — POVIDONE-IODINE 5 % OP SOLN
OPHTHALMIC | Status: DC | PRN
  Administered 2022-03-25: 1 via OPHTHALMIC

## 2022-03-25 MED ORDER — PHENYLEPHRINE HCL 2.5 % OP SOLN
1.0000 [drp] | OPHTHALMIC | Status: AC | PRN
  Administered 2022-03-25 (×3): 1 [drp] via OPHTHALMIC

## 2022-03-25 MED ORDER — TETRACAINE HCL 0.5 % OP SOLN
1.0000 [drp] | OPHTHALMIC | Status: AC | PRN
  Administered 2022-03-25 (×3): 1 [drp] via OPHTHALMIC

## 2022-03-25 MED ORDER — STERILE WATER FOR IRRIGATION IR SOLN
Status: DC | PRN
  Administered 2022-03-25: 50 mL

## 2022-03-25 MED ORDER — FENTANYL CITRATE (PF) 100 MCG/2ML IJ SOLN
INTRAMUSCULAR | Status: AC
  Filled 2022-03-25: qty 2

## 2022-03-25 MED ORDER — SIGHTPATH DOSE#1 NA HYALUR & NA CHOND-NA HYALUR IO KIT
PACK | INTRAOCULAR | Status: DC | PRN
  Administered 2022-03-25: 1 via OPHTHALMIC

## 2022-03-25 MED ORDER — MIDAZOLAM HCL 2 MG/2ML IJ SOLN
INTRAMUSCULAR | Status: AC
  Filled 2022-03-25: qty 2

## 2022-03-25 MED ORDER — LIDOCAINE HCL (PF) 1 % IJ SOLN
INTRAMUSCULAR | Status: DC | PRN
  Administered 2022-03-25: 1 mL

## 2022-03-25 MED ORDER — BSS IO SOLN
INTRAOCULAR | Status: DC | PRN
  Administered 2022-03-25: 15 mL via INTRAOCULAR

## 2022-03-25 MED ORDER — PHENYLEPHRINE-KETOROLAC 1-0.3 % IO SOLN
INTRAOCULAR | Status: AC
  Filled 2022-03-25: qty 4

## 2022-03-25 SURGICAL SUPPLY — 17 items
CATARACT SUITE SIGHTPATH (MISCELLANEOUS) ×1 IMPLANT
CLOTH BEACON ORANGE TIMEOUT ST (SAFETY) ×1 IMPLANT
DRAPE HALF SHEET 40X57 (DRAPES) IMPLANT
DRSG TEGADERM 4X4.75 (GAUZE/BANDAGES/DRESSINGS) ×1 IMPLANT
EYE SHIELD UNIVERSAL CLEAR (GAUZE/BANDAGES/DRESSINGS) IMPLANT
FEE CATARACT SUITE SIGHTPATH (MISCELLANEOUS) ×1 IMPLANT
GLOVE BIOGEL PI IND STRL 7.0 (GLOVE) ×2 IMPLANT
GLOVE SURG SS PI 7.0 STRL IVOR (GLOVE) IMPLANT
LENS IOL RAYNER 21.0 (Intraocular Lens) ×1 IMPLANT
LENS IOL RAYONE EMV 21.0 (Intraocular Lens) IMPLANT
NDL HYPO 18GX1.5 BLUNT FILL (NEEDLE) ×1 IMPLANT
NEEDLE HYPO 18GX1.5 BLUNT FILL (NEEDLE) ×1 IMPLANT
PAD ARMBOARD 7.5X6 YLW CONV (MISCELLANEOUS) ×1 IMPLANT
RING MALYGIN 7.0 (MISCELLANEOUS) IMPLANT
SYR TB 1ML LL NO SAFETY (SYRINGE) ×1 IMPLANT
TAPE SURG TRANSPORE 1 IN (GAUZE/BANDAGES/DRESSINGS) IMPLANT
TAPE SURGICAL TRANSPORE 1 IN (GAUZE/BANDAGES/DRESSINGS) ×1

## 2022-03-25 NOTE — Anesthesia Postprocedure Evaluation (Signed)
Anesthesia Post Note  Patient: Christopher [name]  Procedure(s) Performed: CATARACT EXTRACTION PHACO AND INTRAOCULAR LENS PLACEMENT (IOC) (Left: Eye)  Patient location during evaluation: Phase II Anesthesia Type: MAC Level of consciousness: awake and alert and oriented Pain management: pain level controlled Vital Signs Assessment: post-procedure vital signs reviewed and stable Respiratory status: spontaneous breathing, nonlabored ventilation and respiratory function stable Cardiovascular status: stable and blood pressure returned to baseline Postop Assessment: no apparent nausea or vomiting Anesthetic complications: no   No notable events documented.   Last Vitals:  Vitals:   03/25/22 0913 03/25/22 1040  BP: (!) 145/125 123/61  Pulse: 80 66  Resp: 17 18  Temp: 36.8 C (!) 36.4 C  SpO2: 100% 95%    Last Pain:  Vitals:   03/25/22 1040  TempSrc: Oral  PainSc: 0-No pain                 [name]

## 2022-03-25 NOTE — Discharge Instructions (Signed)
Please discharge patient when stable, will follow up today with Dr. Chetara Kropp at the [facility] [place] office immediately following discharge.  Leave shield in place until visit.  All paperwork with discharge instructions will be given at the office.   [facility] Address:  [address]  [place], [place] [address]  

## 2022-03-25 NOTE — Anesthesia Preprocedure Evaluation (Signed)
Anesthesia Evaluation  Patient identified by MRN, date of birth, ID band Patient awake    Reviewed: Allergy & Precautions, NPO status , Patient's Chart, lab work & pertinent test results  Airway Mallampati: II  TM Distance: >3 FB Neck ROM: Full    Dental  (+) Dental Advisory Given, Upper Dentures   Pulmonary shortness of breath and with exertion, COPD,  COPD inhaler, former smoker (130 pack years),    Pulmonary exam normal breath sounds clear to auscultation       Cardiovascular hypertension, Pt. on medications Normal cardiovascular exam Rhythm:Regular Rate:Normal     Neuro/Psych negative neurological ROS  negative psych ROS   GI/Hepatic negative GI ROS, Neg liver ROS,   Endo/Other  negative endocrine ROS  Renal/GU negative Renal ROS  negative genitourinary   Musculoskeletal negative musculoskeletal ROS (+)   Abdominal   Peds negative pediatric ROS (+)  Hematology negative hematology ROS (+)   Anesthesia Other Findings   Reproductive/Obstetrics negative OB ROS                            Anesthesia Physical Anesthesia Plan  ASA: 3  Anesthesia Plan: MAC   Post-op Pain Management: Minimal or no pain anticipated   Induction:   PONV Risk Score and Plan: Treatment may vary due to age or medical condition  Airway Management Planned: Nasal Cannula and Natural Airway  Additional Equipment:   Intra-op Plan:   Post-operative Plan:   Informed Consent: I have reviewed the patients History and Physical, chart, labs and discussed the procedure including the risks, benefits and alternatives for the proposed anesthesia with the patient or authorized representative who has indicated his/her understanding and acceptance.     Dental advisory given  Plan Discussed with: CRNA and Surgeon  Anesthesia Plan Comments:        Anesthesia Quick Evaluation

## 2022-03-25 NOTE — Transfer of Care (Signed)
Immediate Anesthesia Transfer of Care Note  Patient: Christopher [name]  Procedure(s) Performed: CATARACT EXTRACTION PHACO AND INTRAOCULAR LENS PLACEMENT (IOC) (Left: Eye)  Patient Location: Short Stay  Anesthesia Type:MAC  Level of Consciousness: awake, alert  and oriented  Airway & Oxygen Therapy: Patient Spontanous Breathing  Post-op Assessment: Report given to RN and Post -op Vital signs reviewed and stable  Post vital signs: Reviewed and stable  Last Vitals:  Vitals Value Taken Time  BP 120/78   Temp 98   Pulse 80   Resp 16   SpO2 96     Last Pain:  Vitals:   03/25/22 0915  TempSrc:   PainSc: 0-No pain         Complications: No notable events documented.

## 2022-03-25 NOTE — Interval H&P Note (Signed)
History and Physical Interval Note:  03/25/2022 9:27 AM  The H and P was reviewed and updated. The patient was examined.  No changes were found after exam.  The surgical eye was marked.    Christopher [name]

## 2022-03-25 NOTE — Op Note (Signed)
Date of procedure: 03/25/22  Pre-operative diagnosis: Visually significant age-related combined cataract, Left Eye (H25.812), Poor dilation of the left eye  Post-operative diagnosis:  Visually significant age-related combined cataract, Left Eye (H25.812) Intra-operative floppy iris syndrome, left eye (H21.81)   Procedure:  Complex Removal of cataract via phacoemulsification and insertion of intra-ocular lens Rayner [id number] +21.0D into the capsular bag of the Left Eye (54270)  Attending surgeon: Ronn Melena, MD  Anesthesia: MAC, Topical Akten  Complications: None  Estimated Blood Loss: <6106m (minimal)  Specimens: None  Implants:  Implant Name Type Inv. Item Serial No. Manufacturer Lot No. LRB No. Used Action  LENS IOL RAYNER 21.0 - S07 Intraocular Lens LENS IOL RAYNER 21.0 07 [employer] [id number] Implanted    Indications:  Visually significant age-related cataract, Left Eye  Procedure:  The patient was seen and identified in the pre-operative area. The operative eye was identified and dilated.  The operative eye was marked.  Topical anesthesia was administered to the operative eye.     The patient was then to the operative suite and placed in the supine position.  A timeout was performed confirming the patient, procedure to be performed, and all other relevant information.   The patient's face was prepped and draped in the usual fashion for intra-ocular surgery.  A lid speculum was placed into the operative eye and the surgical microscope moved into place and focused. Poor dilation of the iris was confirmed. An inferotemporal paracentesis was created using a 20 gauge paracentesis blade. BSS mixed with Omidria, followed by 1% lidocaine was injected into the anterior chamber.  Viscoelastic was injected into the anterior chamber.  A temporal clear-corneal main wound incision was created using a 2.473mmicrokeratome.  A 7.106m6106malyugin ring was placed. A continuous curvilinear  capsulorrhexis was initiated using an irrigating cystitome and completed using capsulorrhexis forceps.  Hydrodissection and hydrodeliniation were performed.  Viscoelastic was injected into the anterior chamber.  A phacoemulsification handpiece and a chopper as a second instrument were used to remove the nucleus and epinucleus. The irrigation/aspiration handpiece was used to remove any remaining cortical material.   The capsular bag was reinflated with viscoelastic, checked, and found to be intact.  The intraocular lens was inserted into the capsular bag. The Malyugin ring was removed. The irrigation/aspiration handpiece was used to remove any remaining viscoelastic.  The clear corneal wound and paracentesis wounds were then hydrated and checked with Weck-Cels to be watertight.  The lid-speculum and drape was removed, and the patient's face was cleaned with a wet and dry 4x4.  Moxifloxacin was instilled onto the eye. A clear shield was taped over the eye. The patient was taken to the post-operative care unit in good condition, having tolerated the procedure well.  Post-Op Instructions: [facility] post[facility]operative evaluation and will receive all other orders and instructions.

## 2022-03-29 ENCOUNTER — Encounter (HOSPITAL_COMMUNITY): Payer: Self-pay | Admitting: Optometry
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
## 2022-04-18 ENCOUNTER — Encounter (HOSPITAL_COMMUNITY)
Admission: RE | Admit: 2022-04-18 | Discharge: 2022-04-18 | Disposition: A | Discharge status: Home / Self Care | Payer: Medicare Other | Source: Ambulatory Visit | Attending: Optometry | Admitting: Optometry

## 2022-04-18 NOTE — Pre-Procedure Instructions (Signed)
Attempted pre-op phone call for second time and mailbox remains full so I cannot leave a message for him to call back.

## 2022-04-22 ENCOUNTER — Encounter (HOSPITAL_COMMUNITY)
Admission: RE | Disposition: A | Discharge status: Home / Self Care | Payer: Self-pay | Source: Home / Self Care | Attending: Optometry

## 2022-04-22 ENCOUNTER — Ambulatory Visit (HOSPITAL_COMMUNITY)
Admission: RE | Admit: 2022-04-22 | Discharge: 2022-04-22 | Disposition: A | Discharge status: Home / Self Care | Payer: Medicare Other | Attending: Optometry | Admitting: Optometry

## 2022-04-22 ENCOUNTER — Encounter (HOSPITAL_COMMUNITY): Payer: Self-pay | Admitting: Optometry

## 2022-04-22 ENCOUNTER — Ambulatory Visit (HOSPITAL_COMMUNITY): Payer: Medicare Other | Admitting: Anesthesiology

## 2022-04-22 ENCOUNTER — Ambulatory Visit (HOSPITAL_BASED_OUTPATIENT_CLINIC_OR_DEPARTMENT_OTHER): Payer: Medicare Other | Admitting: Anesthesiology

## 2022-04-22 DIAGNOSIS — H2511 Age-related nuclear cataract, right eye: Secondary | ICD-10-CM

## 2022-04-22 DIAGNOSIS — I10 Essential (primary) hypertension: Secondary | ICD-10-CM

## 2022-04-22 DIAGNOSIS — Z79899 Other long term (current) drug therapy: Secondary | ICD-10-CM | POA: Diagnosis not present

## 2022-04-22 DIAGNOSIS — H25811 Combined forms of age-related cataract, right eye: Secondary | ICD-10-CM | POA: Diagnosis present

## 2022-04-22 DIAGNOSIS — Z87891 Personal history of nicotine dependence: Secondary | ICD-10-CM

## 2022-04-22 DIAGNOSIS — Z961 Presence of intraocular lens: Secondary | ICD-10-CM | POA: Insufficient documentation

## 2022-04-22 DIAGNOSIS — J449 Chronic obstructive pulmonary disease, unspecified: Secondary | ICD-10-CM

## 2022-04-22 DIAGNOSIS — H5201 Hypermetropia, right eye: Secondary | ICD-10-CM | POA: Diagnosis not present

## 2022-04-22 DIAGNOSIS — Z9842 Cataract extraction status, left eye: Secondary | ICD-10-CM | POA: Insufficient documentation

## 2022-04-22 DIAGNOSIS — H2181 Floppy iris syndrome: Secondary | ICD-10-CM

## 2022-04-22 HISTORY — PX: CATARACT EXTRACTION W/PHACO: SHX586

## 2022-04-22 SURGERY — PHACOEMULSIFICATION, CATARACT, WITH IOL INSERTION
Anesthesia: Monitor Anesthesia Care | Site: Eye | Laterality: Right

## 2022-04-22 MED ORDER — MIDAZOLAM HCL 2 MG/2ML IJ SOLN
INTRAMUSCULAR | Status: DC | PRN
  Administered 2022-04-22: 1 mg via INTRAVENOUS

## 2022-04-22 MED ORDER — BSS IO SOLN
INTRAOCULAR | Status: DC | PRN
  Administered 2022-04-22: 15 mL via INTRAOCULAR

## 2022-04-22 MED ORDER — MIDAZOLAM HCL 2 MG/2ML IJ SOLN
INTRAMUSCULAR | Status: AC
  Filled 2022-04-22: qty 2

## 2022-04-22 MED ORDER — TROPICAMIDE 1 % OP SOLN
1.0000 [drp] | OPHTHALMIC | Status: AC
  Administered 2022-04-22 (×3): 1 [drp] via OPHTHALMIC
  Filled 2022-04-22: qty 3

## 2022-04-22 MED ORDER — FENTANYL CITRATE (PF) 100 MCG/2ML IJ SOLN
INTRAMUSCULAR | Status: DC | PRN
  Administered 2022-04-22: 50 ug via INTRAVENOUS

## 2022-04-22 MED ORDER — SODIUM CHLORIDE 0.9% FLUSH
INTRAVENOUS | Status: DC | PRN
  Administered 2022-04-22: 5 mL via INTRAVENOUS
  Administered 2022-04-22: 3 mL via INTRAVENOUS

## 2022-04-22 MED ORDER — FENTANYL CITRATE (PF) 100 MCG/2ML IJ SOLN
INTRAMUSCULAR | Status: AC
  Filled 2022-04-22: qty 2

## 2022-04-22 MED ORDER — LIDOCAINE HCL 3.5 % OP GEL
1.0000 | Freq: Once | OPHTHALMIC | Status: AC
  Administered 2022-04-22: 1 via OPHTHALMIC

## 2022-04-22 MED ORDER — MOXIFLOXACIN HCL 5 MG/ML IO SOLN
INTRAOCULAR | Status: AC
  Filled 2022-04-22: qty 1

## 2022-04-22 MED ORDER — PHENYLEPHRINE-KETOROLAC 1-0.3 % IO SOLN
INTRAOCULAR | Status: DC | PRN
  Administered 2022-04-22: 500 mL via OPHTHALMIC

## 2022-04-22 MED ORDER — POVIDONE-IODINE 5 % OP SOLN
OPHTHALMIC | Status: DC | PRN
  Administered 2022-04-22: 1 via OPHTHALMIC

## 2022-04-22 MED ORDER — TETRACAINE HCL 0.5 % OP SOLN
1.0000 [drp] | OPHTHALMIC | Status: AC
  Administered 2022-04-22 (×3): 1 [drp] via OPHTHALMIC

## 2022-04-22 MED ORDER — LACTATED RINGERS IV SOLN: INTRAVENOUS | Status: DC

## 2022-04-22 MED ORDER — PHENYLEPHRINE HCL 2.5 % OP SOLN
1.0000 [drp] | OPHTHALMIC | Status: AC
  Administered 2022-04-22 (×3): 1 [drp] via OPHTHALMIC

## 2022-04-22 MED ORDER — SIGHTPATH DOSE#1 NA HYALUR & NA CHOND-NA HYALUR IO KIT
PACK | INTRAOCULAR | Status: DC | PRN
  Administered 2022-04-22: 1 via OPHTHALMIC

## 2022-04-22 MED ORDER — MOXIFLOXACIN HCL 0.5 % OP SOLN
OPHTHALMIC | Status: DC | PRN
  Administered 2022-04-22: .2 mL via OPHTHALMIC

## 2022-04-22 MED ORDER — STERILE WATER FOR IRRIGATION IR SOLN
Status: DC | PRN
  Administered 2022-04-22: 250 mL

## 2022-04-22 MED ORDER — LIDOCAINE HCL (PF) 1 % IJ SOLN
INTRAMUSCULAR | Status: DC | PRN
  Administered 2022-04-22: 1 mL

## 2022-04-22 MED ORDER — PHENYLEPHRINE-KETOROLAC 1-0.3 % IO SOLN
INTRAOCULAR | Status: AC
  Filled 2022-04-22: qty 4

## 2022-04-22 SURGICAL SUPPLY — 18 items
CATARACT SUITE SIGHTPATH (MISCELLANEOUS) ×1 IMPLANT
CLOTH BEACON ORANGE TIMEOUT ST (SAFETY) ×1 IMPLANT
DRSG TEGADERM 2-3/8X2-3/4 SM (GAUZE/BANDAGES/DRESSINGS) IMPLANT
DRSG TEGADERM 4X4.75 (GAUZE/BANDAGES/DRESSINGS) ×1 IMPLANT
EYE SHIELD UNIVERSAL CLEAR (GAUZE/BANDAGES/DRESSINGS) IMPLANT
FEE CATARACT SUITE SIGHTPATH (MISCELLANEOUS) ×1 IMPLANT
GLOVE BIOGEL PI IND STRL 6.5 (GLOVE) IMPLANT
GLOVE BIOGEL PI IND STRL 7.0 (GLOVE) ×2 IMPLANT
LENS IOL RAYNER 20.5 (Intraocular Lens) ×1 IMPLANT
LENS IOL RAYONE EMV 20.5 (Intraocular Lens) IMPLANT
NDL HYPO 18GX1.5 BLUNT FILL (NEEDLE) ×1 IMPLANT
NEEDLE HYPO 18GX1.5 BLUNT FILL (NEEDLE) ×1 IMPLANT
PAD ARMBOARD 7.5X6 YLW CONV (MISCELLANEOUS) ×1 IMPLANT
RING MALYGIN 7.0 (MISCELLANEOUS) IMPLANT
SYR TB 1ML LL NO SAFETY (SYRINGE) ×1 IMPLANT
TAPE SURG TRANSPORE 1 IN (GAUZE/BANDAGES/DRESSINGS) IMPLANT
TAPE SURGICAL TRANSPORE 1 IN (GAUZE/BANDAGES/DRESSINGS) ×1
WATER STERILE IRR 250ML POUR (IV SOLUTION) ×1 IMPLANT

## 2022-04-22 NOTE — Discharge Instructions (Signed)
Please discharge patient when stable, will follow up today with Dr. Liann Spaeth at the [facility] [place] office immediately following discharge.  Leave shield in place until visit.  All paperwork with discharge instructions will be given at the office.  [facility] [place] Address:  [address]  [place], [place] [address]  Dr. Macai Sisneros's Phone: [phone]  

## 2022-04-22 NOTE — Anesthesia Preprocedure Evaluation (Signed)
Anesthesia Evaluation  Patient identified by MRN, date of birth, ID band Patient awake    Reviewed: Allergy & Precautions, NPO status , Patient's Chart, lab work & pertinent test results  Airway Mallampati: II  TM Distance: >3 FB Neck ROM: Full    Dental  (+) Upper Dentures, Lower Dentures   Pulmonary shortness of breath and with exertion, COPD,  COPD inhaler, former smoker   Pulmonary exam normal breath sounds clear to auscultation       Cardiovascular hypertension, Pt. on medications Normal cardiovascular exam Rhythm:Regular Rate:Normal     Neuro/Psych negative neurological ROS  negative psych ROS   GI/Hepatic negative GI ROS, Neg liver ROS,,,  Endo/Other  negative endocrine ROS    Renal/GU negative Renal ROS  negative genitourinary   Musculoskeletal negative musculoskeletal ROS (+)    Abdominal   Peds negative pediatric ROS (+)  Hematology negative hematology ROS (+)   Anesthesia Other Findings   Reproductive/Obstetrics negative OB ROS                             Anesthesia Physical Anesthesia Plan  ASA: 3  Anesthesia Plan: MAC   Post-op Pain Management: Minimal or no pain anticipated   Induction:   PONV Risk Score and Plan: Treatment may vary due to age or medical condition  Airway Management Planned: Nasal Cannula and Natural Airway  Additional Equipment:   Intra-op Plan:   Post-operative Plan:   Informed Consent: I have reviewed the patients History and Physical, chart, labs and discussed the procedure including the risks, benefits and alternatives for the proposed anesthesia with the patient or authorized representative who has indicated his/her understanding and acceptance.     Dental advisory given  Plan Discussed with: CRNA and Surgeon  Anesthesia Plan Comments:         Anesthesia Quick Evaluation

## 2022-04-22 NOTE — Interval H&P Note (Signed)
History and Physical Interval Note:  04/22/2022 9:03 AM  The H and P was reviewed and updated. The patient was examined.  No changes were found after exam.  The surgical eye was marked.   Christopher [name]

## 2022-04-22 NOTE — Op Note (Signed)
Date of procedure: 04/22/22  Pre-operative diagnosis: Visually significant age-related nuclear cataract, Right Eye (H25.11); Poor Dilation, Right Eye (H21.81)  Post-operative diagnosis: Visually significant age-related cataract, Right Eye; Intra-operative Floppy Iris Syndrome, Right Eye (H21.81)  Procedure: Removal of cataract via phacoemulsification and insertion of intra-ocular lens Rayner [id number] +20.5D into the capsular bag of the Right Eye (CPT [phone])  Attending surgeon: Ronn Melena, MD  Anesthesia: MAC, Topical Akten  Complications: None  Estimated Blood Loss: <36m (minimal)  Specimens: None  Implants:  Implant Name Type Inv. Item Serial No. Manufacturer Lot No. LRB No. Used Action  LENS IOL RAYNER 20.5 - S61 Intraocular Lens LENS IOL RAYNER 20.5 61 [employer] [id number] Implanted    Indications:  Visually significant cataract, Right Eye  Procedure:  The patient was seen and identified in the pre-operative area. The operative eye was identified and dilated.  The operative eye was marked.  Topical anesthesia was administered to the operative eye.     The patient was then to the operative suite and placed in the supine position.  A timeout was performed confirming the patient, procedure to be performed, and all other relevant information.   The patient's face was prepped and draped in the usual fashion for intra-ocular surgery.  A lid speculum was placed into the operative eye and the surgical microscope moved into place and focused.  Poor dilation of the iris was confirmed.  A superotemporal paracentesis was created using a 20 gauge paracentesis blade.  Shugarcaine was injected into the anterior chamber.  Viscoelastic was injected into the anterior chamber.  A temporal clear-corneal main wound incision was created using a 2.443mmicrokeratome.  A Malyugin ring was placed.  A continuous curvilinear capsulorrhexis was initiated using an irrigating cystitome and completed  using capsulorrhexis forceps.  Hydrodissection and hydrodeliniation were performed.  Viscoelastic was injected into the anterior chamber.  A phacoemulsification handpiece and a chopper as a second instrument were used to remove the nucleus and epinucleus. The irrigation/aspiration handpiece was used to remove any remaining cortical material.   The capsular bag was reinflated with viscoelastic, checked, and found to be intact.  The intraocular lens was inserted into the capsular bag and dialed into place using a MaSurveyor, minerals The Malyugin ring was removed.  The irrigation/aspiration handpiece was used to remove any remaining viscoelastic.  The clear corneal wound and paracentesis wounds were then hydrated and checked with Weck-Cels to be watertight. [id number] moxifloxacin was injected into the anterior chamber.  The lid-speculum and drape was removed, and the patient's face was cleaned with a wet and dry 4x4. A clear shield was taped over the eye. The patient was taken to the post-operative care unit in good condition, having tolerated the procedure well.  Post-Op Instructions: [facility] and will receive all other orders and instructions.

## 2022-04-22 NOTE — Transfer of Care (Signed)
Immediate Anesthesia Transfer of Care Note  Patient: Christopher [name]  Procedure(s) Performed: CATARACT EXTRACTION PHACO AND INTRAOCULAR LENS PLACEMENT (IOC) (Right: Eye)  Patient Location: Short Stay  Anesthesia Type:MAC  Level of Consciousness: awake, alert , oriented, and patient cooperative  Airway & Oxygen Therapy: Patient Spontanous Breathing  Post-op Assessment: Report given to RN, Post -op Vital signs reviewed and stable, and Patient moving all extremities X 4  Post vital signs: Reviewed and stable  Last Vitals:  Vitals Value Taken Time  BP    Temp    Pulse    Resp    SpO2      Last Pain:  Vitals:   04/22/22 0743  TempSrc: Oral  PainSc: 0-No pain         Complications: No notable events documented.

## 2022-04-22 NOTE — Anesthesia Postprocedure Evaluation (Signed)
Anesthesia Post Note  Patient: Christopher [name]  Procedure(s) Performed: CATARACT EXTRACTION PHACO AND INTRAOCULAR LENS PLACEMENT (IOC) (Right: Eye)  Patient location during evaluation: Phase II Anesthesia Type: MAC Level of consciousness: awake and alert and oriented Pain management: pain level controlled Vital Signs Assessment: post-procedure vital signs reviewed and stable Respiratory status: spontaneous breathing, nonlabored ventilation and respiratory function stable Cardiovascular status: blood pressure returned to baseline and stable Postop Assessment: no apparent nausea or vomiting Anesthetic complications: no  No notable events documented.   Last Vitals:  Vitals:   04/22/22 0743 04/22/22 0938  BP: 136/63 112/65  Pulse: 72 71  Resp: 17 14  Temp: 36.6 C (!) 36.4 C  SpO2: 100% 99%    Last Pain:  Vitals:   04/22/22 0938  TempSrc: Oral  PainSc: 0-No pain                 Sydney Azure C Eda Magnussen

## 2022-04-28 ENCOUNTER — Encounter (HOSPITAL_COMMUNITY): Payer: Self-pay | Admitting: Optometry

## 2023-06-16 DIAGNOSIS — Z79891 Long term (current) use of opiate analgesic: Secondary | ICD-10-CM | POA: Diagnosis not present

## 2023-06-16 DIAGNOSIS — Z79899 Other long term (current) drug therapy: Secondary | ICD-10-CM | POA: Diagnosis not present

## 2023-06-16 DIAGNOSIS — G894 Chronic pain syndrome: Secondary | ICD-10-CM | POA: Diagnosis not present

## 2023-06-16 DIAGNOSIS — M5416 Radiculopathy, lumbar region: Secondary | ICD-10-CM | POA: Diagnosis not present

## 2023-06-22 DIAGNOSIS — F17210 Nicotine dependence, cigarettes, uncomplicated: Secondary | ICD-10-CM | POA: Diagnosis not present

## 2023-06-22 DIAGNOSIS — G8929 Other chronic pain: Secondary | ICD-10-CM | POA: Diagnosis not present

## 2023-06-22 DIAGNOSIS — E7849 Other hyperlipidemia: Secondary | ICD-10-CM | POA: Diagnosis not present

## 2023-06-22 DIAGNOSIS — I10 Essential (primary) hypertension: Secondary | ICD-10-CM | POA: Diagnosis not present

## 2023-06-22 DIAGNOSIS — R0609 Other forms of dyspnea: Secondary | ICD-10-CM | POA: Diagnosis not present

## 2023-06-30 DIAGNOSIS — G894 Chronic pain syndrome: Secondary | ICD-10-CM | POA: Diagnosis not present

## 2023-06-30 DIAGNOSIS — Z79891 Long term (current) use of opiate analgesic: Secondary | ICD-10-CM | POA: Diagnosis not present

## 2023-06-30 DIAGNOSIS — Z79899 Other long term (current) drug therapy: Secondary | ICD-10-CM | POA: Diagnosis not present

## 2023-06-30 DIAGNOSIS — M5416 Radiculopathy, lumbar region: Secondary | ICD-10-CM | POA: Diagnosis not present

## 2023-07-13 DIAGNOSIS — E7849 Other hyperlipidemia: Secondary | ICD-10-CM | POA: Diagnosis not present

## 2023-07-13 DIAGNOSIS — F17210 Nicotine dependence, cigarettes, uncomplicated: Secondary | ICD-10-CM | POA: Diagnosis not present

## 2023-07-13 DIAGNOSIS — I10 Essential (primary) hypertension: Secondary | ICD-10-CM | POA: Diagnosis not present

## 2023-07-13 DIAGNOSIS — G8929 Other chronic pain: Secondary | ICD-10-CM | POA: Diagnosis not present

## 2023-07-13 DIAGNOSIS — R0609 Other forms of dyspnea: Secondary | ICD-10-CM | POA: Diagnosis not present

## 2023-07-13 DIAGNOSIS — Z09 Encounter for follow-up examination after completed treatment for conditions other than malignant neoplasm: Secondary | ICD-10-CM | POA: Diagnosis not present

## 2023-07-13 DIAGNOSIS — M545 Low back pain, unspecified: Secondary | ICD-10-CM | POA: Diagnosis not present

## 2023-07-13 DIAGNOSIS — E038 Other specified hypothyroidism: Secondary | ICD-10-CM | POA: Diagnosis not present

## 2023-07-24 ENCOUNTER — Other Ambulatory Visit (HOSPITAL_COMMUNITY): Payer: Self-pay | Admitting: Adult Health

## 2023-07-24 DIAGNOSIS — G8929 Other chronic pain: Secondary | ICD-10-CM | POA: Diagnosis not present

## 2023-07-24 DIAGNOSIS — Z85830 Personal history of malignant neoplasm of bone: Secondary | ICD-10-CM

## 2023-07-24 DIAGNOSIS — Z808 Family history of malignant neoplasm of other organs or systems: Secondary | ICD-10-CM | POA: Diagnosis not present

## 2023-07-24 DIAGNOSIS — R519 Headache, unspecified: Secondary | ICD-10-CM | POA: Diagnosis not present

## 2023-07-24 DIAGNOSIS — R52 Pain, unspecified: Secondary | ICD-10-CM

## 2023-07-24 DIAGNOSIS — M5459 Other low back pain: Secondary | ICD-10-CM | POA: Diagnosis not present

## 2023-07-24 DIAGNOSIS — E7849 Other hyperlipidemia: Secondary | ICD-10-CM | POA: Diagnosis not present

## 2023-07-24 DIAGNOSIS — I10 Essential (primary) hypertension: Secondary | ICD-10-CM | POA: Diagnosis not present

## 2023-08-02 DIAGNOSIS — J069 Acute upper respiratory infection, unspecified: Secondary | ICD-10-CM | POA: Diagnosis not present

## 2023-08-02 DIAGNOSIS — J449 Chronic obstructive pulmonary disease, unspecified: Secondary | ICD-10-CM | POA: Diagnosis not present

## 2023-08-02 DIAGNOSIS — J029 Acute pharyngitis, unspecified: Secondary | ICD-10-CM | POA: Diagnosis not present

## 2023-08-04 DIAGNOSIS — J441 Chronic obstructive pulmonary disease with (acute) exacerbation: Secondary | ICD-10-CM | POA: Diagnosis not present

## 2023-08-04 DIAGNOSIS — J209 Acute bronchitis, unspecified: Secondary | ICD-10-CM | POA: Diagnosis not present

## 2023-08-04 DIAGNOSIS — Z681 Body mass index (BMI) 19 or less, adult: Secondary | ICD-10-CM | POA: Diagnosis not present

## 2023-08-08 DIAGNOSIS — J441 Chronic obstructive pulmonary disease with (acute) exacerbation: Secondary | ICD-10-CM | POA: Diagnosis not present

## 2023-08-10 ENCOUNTER — Other Ambulatory Visit (HOSPITAL_COMMUNITY): Payer: Self-pay | Admitting: Adult Health

## 2023-08-10 ENCOUNTER — Ambulatory Visit (HOSPITAL_COMMUNITY)
Admission: RE | Admit: 2023-08-10 | Discharge: 2023-08-10 | Disposition: A | Discharge status: Home / Self Care | Payer: 59 | Source: Ambulatory Visit | Attending: Adult Health | Admitting: Adult Health

## 2023-08-10 DIAGNOSIS — R52 Pain, unspecified: Secondary | ICD-10-CM | POA: Diagnosis not present

## 2023-08-10 DIAGNOSIS — M5136 Other intervertebral disc degeneration, lumbar region with discogenic back pain only: Secondary | ICD-10-CM | POA: Diagnosis not present

## 2023-08-10 DIAGNOSIS — Z85830 Personal history of malignant neoplasm of bone: Secondary | ICD-10-CM | POA: Insufficient documentation

## 2023-08-10 DIAGNOSIS — M549 Dorsalgia, unspecified: Secondary | ICD-10-CM | POA: Diagnosis not present

## 2023-08-10 DIAGNOSIS — M48061 Spinal stenosis, lumbar region without neurogenic claudication: Secondary | ICD-10-CM | POA: Diagnosis not present

## 2023-08-10 DIAGNOSIS — M4804 Spinal stenosis, thoracic region: Secondary | ICD-10-CM | POA: Diagnosis not present

## 2023-08-10 DIAGNOSIS — R519 Headache, unspecified: Secondary | ICD-10-CM | POA: Diagnosis not present

## 2023-08-10 DIAGNOSIS — M5134 Other intervertebral disc degeneration, thoracic region: Secondary | ICD-10-CM | POA: Diagnosis not present

## 2023-08-15 DIAGNOSIS — M5416 Radiculopathy, lumbar region: Secondary | ICD-10-CM | POA: Diagnosis not present

## 2023-08-15 DIAGNOSIS — Z79891 Long term (current) use of opiate analgesic: Secondary | ICD-10-CM | POA: Diagnosis not present

## 2023-08-15 DIAGNOSIS — Z79899 Other long term (current) drug therapy: Secondary | ICD-10-CM | POA: Diagnosis not present

## 2023-08-15 DIAGNOSIS — G894 Chronic pain syndrome: Secondary | ICD-10-CM | POA: Diagnosis not present

## 2023-08-17 DIAGNOSIS — G8929 Other chronic pain: Secondary | ICD-10-CM | POA: Diagnosis not present

## 2023-08-17 DIAGNOSIS — I10 Essential (primary) hypertension: Secondary | ICD-10-CM | POA: Diagnosis not present

## 2023-08-17 DIAGNOSIS — E7849 Other hyperlipidemia: Secondary | ICD-10-CM | POA: Diagnosis not present

## 2023-08-17 DIAGNOSIS — F17200 Nicotine dependence, unspecified, uncomplicated: Secondary | ICD-10-CM | POA: Diagnosis not present

## 2023-08-17 DIAGNOSIS — R519 Headache, unspecified: Secondary | ICD-10-CM | POA: Diagnosis not present

## 2023-08-17 DIAGNOSIS — Z808 Family history of malignant neoplasm of other organs or systems: Secondary | ICD-10-CM | POA: Diagnosis not present

## 2023-08-17 DIAGNOSIS — E038 Other specified hypothyroidism: Secondary | ICD-10-CM | POA: Diagnosis not present

## 2023-08-17 DIAGNOSIS — R0609 Other forms of dyspnea: Secondary | ICD-10-CM | POA: Diagnosis not present

## 2023-08-17 DIAGNOSIS — M5459 Other low back pain: Secondary | ICD-10-CM | POA: Diagnosis not present

## 2023-09-13 DIAGNOSIS — Z808 Family history of malignant neoplasm of other organs or systems: Secondary | ICD-10-CM | POA: Diagnosis not present

## 2023-09-13 DIAGNOSIS — M5459 Other low back pain: Secondary | ICD-10-CM | POA: Diagnosis not present

## 2023-09-13 DIAGNOSIS — Z79899 Other long term (current) drug therapy: Secondary | ICD-10-CM | POA: Diagnosis not present

## 2023-09-13 DIAGNOSIS — G8929 Other chronic pain: Secondary | ICD-10-CM | POA: Diagnosis not present

## 2023-09-13 DIAGNOSIS — Z79891 Long term (current) use of opiate analgesic: Secondary | ICD-10-CM | POA: Diagnosis not present

## 2023-09-13 DIAGNOSIS — M5416 Radiculopathy, lumbar region: Secondary | ICD-10-CM | POA: Diagnosis not present

## 2023-09-13 DIAGNOSIS — I10 Essential (primary) hypertension: Secondary | ICD-10-CM | POA: Diagnosis not present

## 2023-09-13 DIAGNOSIS — R519 Headache, unspecified: Secondary | ICD-10-CM | POA: Diagnosis not present

## 2023-09-13 DIAGNOSIS — G894 Chronic pain syndrome: Secondary | ICD-10-CM | POA: Diagnosis not present

## 2023-10-06 DIAGNOSIS — G894 Chronic pain syndrome: Secondary | ICD-10-CM | POA: Diagnosis not present

## 2023-10-06 DIAGNOSIS — Z79899 Other long term (current) drug therapy: Secondary | ICD-10-CM | POA: Diagnosis not present

## 2023-10-06 DIAGNOSIS — M5416 Radiculopathy, lumbar region: Secondary | ICD-10-CM | POA: Diagnosis not present

## 2023-10-06 DIAGNOSIS — Z79891 Long term (current) use of opiate analgesic: Secondary | ICD-10-CM | POA: Diagnosis not present

## 2023-10-11 DIAGNOSIS — M898X9 Other specified disorders of bone, unspecified site: Secondary | ICD-10-CM | POA: Diagnosis not present

## 2023-10-11 DIAGNOSIS — G8929 Other chronic pain: Secondary | ICD-10-CM | POA: Diagnosis not present

## 2023-11-03 DIAGNOSIS — Z79891 Long term (current) use of opiate analgesic: Secondary | ICD-10-CM | POA: Diagnosis not present

## 2023-11-03 DIAGNOSIS — G894 Chronic pain syndrome: Secondary | ICD-10-CM | POA: Diagnosis not present

## 2023-11-03 DIAGNOSIS — Z79899 Other long term (current) drug therapy: Secondary | ICD-10-CM | POA: Diagnosis not present

## 2023-11-03 DIAGNOSIS — M5416 Radiculopathy, lumbar region: Secondary | ICD-10-CM | POA: Diagnosis not present

## 2023-11-16 DIAGNOSIS — G8929 Other chronic pain: Secondary | ICD-10-CM | POA: Diagnosis not present

## 2023-11-16 DIAGNOSIS — G501 Atypical facial pain: Secondary | ICD-10-CM | POA: Diagnosis not present

## 2023-12-12 DIAGNOSIS — G501 Atypical facial pain: Secondary | ICD-10-CM | POA: Diagnosis not present

## 2023-12-12 DIAGNOSIS — G8929 Other chronic pain: Secondary | ICD-10-CM | POA: Diagnosis not present

## 2023-12-12 DIAGNOSIS — M5459 Other low back pain: Secondary | ICD-10-CM | POA: Diagnosis not present

## 2024-01-09 DIAGNOSIS — G501 Atypical facial pain: Secondary | ICD-10-CM | POA: Diagnosis not present

## 2024-01-09 DIAGNOSIS — M5459 Other low back pain: Secondary | ICD-10-CM | POA: Diagnosis not present

## 2024-01-09 DIAGNOSIS — Z79899 Other long term (current) drug therapy: Secondary | ICD-10-CM | POA: Diagnosis not present

## 2024-01-09 DIAGNOSIS — Z79891 Long term (current) use of opiate analgesic: Secondary | ICD-10-CM | POA: Diagnosis not present

## 2024-01-09 DIAGNOSIS — M5416 Radiculopathy, lumbar region: Secondary | ICD-10-CM | POA: Diagnosis not present

## 2024-01-09 DIAGNOSIS — G8929 Other chronic pain: Secondary | ICD-10-CM | POA: Diagnosis not present

## 2024-01-09 DIAGNOSIS — G894 Chronic pain syndrome: Secondary | ICD-10-CM | POA: Diagnosis not present

## 2024-03-05 DIAGNOSIS — Z79899 Other long term (current) drug therapy: Secondary | ICD-10-CM | POA: Diagnosis not present

## 2024-03-05 DIAGNOSIS — M5416 Radiculopathy, lumbar region: Secondary | ICD-10-CM | POA: Diagnosis not present

## 2024-03-05 DIAGNOSIS — J00 Acute nasopharyngitis [common cold]: Secondary | ICD-10-CM | POA: Diagnosis not present

## 2024-03-05 DIAGNOSIS — G8929 Other chronic pain: Secondary | ICD-10-CM | POA: Diagnosis not present

## 2024-03-05 DIAGNOSIS — Z79891 Long term (current) use of opiate analgesic: Secondary | ICD-10-CM | POA: Diagnosis not present

## 2024-03-05 DIAGNOSIS — M5459 Other low back pain: Secondary | ICD-10-CM | POA: Diagnosis not present

## 2024-03-05 DIAGNOSIS — G894 Chronic pain syndrome: Secondary | ICD-10-CM | POA: Diagnosis not present

## 2024-03-05 DIAGNOSIS — G501 Atypical facial pain: Secondary | ICD-10-CM | POA: Diagnosis not present

## 2024-03-05 DIAGNOSIS — R058 Other specified cough: Secondary | ICD-10-CM | POA: Diagnosis not present
